# Patient Record
Sex: Female | Born: 1955 | Race: White | Hispanic: No | Marital: Married | Smoking: Never smoker
Health system: Southern US, Community
[De-identification: ages and names within clinical notes are randomized; demographics above are authoritative.]

## PROBLEM LIST (undated history)

## (undated) DIAGNOSIS — K219 Gastro-esophageal reflux disease without esophagitis: Secondary | ICD-10-CM

## (undated) DIAGNOSIS — N951 Menopausal and female climacteric states: Secondary | ICD-10-CM

## (undated) DIAGNOSIS — E785 Hyperlipidemia, unspecified: Secondary | ICD-10-CM

## (undated) DIAGNOSIS — I639 Cerebral infarction, unspecified: Secondary | ICD-10-CM

## (undated) DIAGNOSIS — E119 Type 2 diabetes mellitus without complications: Secondary | ICD-10-CM

## (undated) DIAGNOSIS — R197 Diarrhea, unspecified: Secondary | ICD-10-CM

## (undated) DIAGNOSIS — M47812 Spondylosis without myelopathy or radiculopathy, cervical region: Secondary | ICD-10-CM

## (undated) DIAGNOSIS — R2681 Unsteadiness on feet: Secondary | ICD-10-CM

## (undated) DIAGNOSIS — H269 Unspecified cataract: Secondary | ICD-10-CM

## (undated) DIAGNOSIS — A048 Other specified bacterial intestinal infections: Secondary | ICD-10-CM

## (undated) DIAGNOSIS — T7840XA Allergy, unspecified, initial encounter: Secondary | ICD-10-CM

## (undated) DIAGNOSIS — R002 Palpitations: Secondary | ICD-10-CM

## (undated) DIAGNOSIS — M542 Cervicalgia: Secondary | ICD-10-CM

## (undated) DIAGNOSIS — M47817 Spondylosis without myelopathy or radiculopathy, lumbosacral region: Secondary | ICD-10-CM

## (undated) DIAGNOSIS — R079 Chest pain, unspecified: Secondary | ICD-10-CM

## (undated) DIAGNOSIS — Z01810 Encounter for preprocedural cardiovascular examination: Secondary | ICD-10-CM

## (undated) DIAGNOSIS — I10 Essential (primary) hypertension: Secondary | ICD-10-CM

## (undated) DIAGNOSIS — K635 Polyp of colon: Secondary | ICD-10-CM

## (undated) DIAGNOSIS — E209 Hypoparathyroidism, unspecified: Secondary | ICD-10-CM

## (undated) DIAGNOSIS — J309 Allergic rhinitis, unspecified: Secondary | ICD-10-CM

## (undated) DIAGNOSIS — M810 Age-related osteoporosis without current pathological fracture: Secondary | ICD-10-CM

## (undated) DIAGNOSIS — R42 Dizziness and giddiness: Secondary | ICD-10-CM

## (undated) DIAGNOSIS — H905 Unspecified sensorineural hearing loss: Secondary | ICD-10-CM

## (undated) DIAGNOSIS — F419 Anxiety disorder, unspecified: Secondary | ICD-10-CM

## (undated) DIAGNOSIS — J45909 Unspecified asthma, uncomplicated: Secondary | ICD-10-CM

## (undated) DIAGNOSIS — E039 Hypothyroidism, unspecified: Secondary | ICD-10-CM

## (undated) HISTORY — PX: CHOLECYSTECTOMY: SHX55

## (undated) HISTORY — DX: Unspecified asthma, uncomplicated: J45.909

## (undated) HISTORY — PX: COLONOSCOPY: SHX174

## (undated) HISTORY — DX: Hyperlipidemia, unspecified: E78.5

## (undated) HISTORY — PX: ALLOGRAFT SPINE MORSELIZED: SUR18

## (undated) HISTORY — DX: Cervicalgia: M54.2

## (undated) HISTORY — PX: APPENDECTOMY: SHX54

## (undated) HISTORY — DX: Cerebral infarction, unspecified: I63.9

## (undated) HISTORY — DX: Essential (primary) hypertension: I10

## (undated) HISTORY — DX: Polyp of colon: K63.5

## (undated) HISTORY — DX: Hypothyroidism, unspecified: E03.9

## (undated) HISTORY — DX: Menopausal and female climacteric states: N95.1

## (undated) HISTORY — DX: Anxiety disorder, unspecified: F41.9

## (undated) HISTORY — DX: Diarrhea, unspecified: R19.7

## (undated) HISTORY — DX: Unsteadiness on feet: R26.81

## (undated) HISTORY — PX: UPPER GASTROINTESTINAL ENDOSCOPY: SHX188

## (undated) HISTORY — DX: Gastro-esophageal reflux disease without esophagitis: K21.9

## (undated) HISTORY — DX: Type 2 diabetes mellitus without complications: E11.9

## (undated) HISTORY — DX: Age-related osteoporosis without current pathological fracture: M81.0

## (undated) HISTORY — DX: Palpitations: R00.2

## (undated) HISTORY — DX: Encounter for preprocedural cardiovascular examination: Z01.810

## (undated) HISTORY — PX: LAPAROTOMY: SHX154

## (undated) HISTORY — PX: HYSTEROSCOPY WITH SALPINGOGRAM: SHX6653

## (undated) HISTORY — DX: Dizziness and giddiness: R42

## (undated) HISTORY — DX: Hypoparathyroidism, unspecified: E20.9

## (undated) HISTORY — PX: ABDOMINAL HYSTERECTOMY: SHX81

## (undated) HISTORY — PX: CATARACT EXTRACTION: SUR2

## (undated) HISTORY — DX: Unspecified cataract: H26.9

## (undated) HISTORY — DX: Other specified bacterial intestinal infections: A04.8

## (undated) HISTORY — DX: Allergy, unspecified, initial encounter: T78.40XA

## (undated) HISTORY — DX: Chest pain, unspecified: R07.9

## (undated) HISTORY — PX: TONSILLECTOMY: SUR1361

## (undated) HISTORY — DX: Unspecified sensorineural hearing loss: H90.5

## (undated) HISTORY — DX: Allergic rhinitis, unspecified: J30.9

## (undated) HISTORY — DX: Spondylosis without myelopathy or radiculopathy, lumbosacral region: M47.817

## (undated) HISTORY — DX: Spondylosis without myelopathy or radiculopathy, cervical region: M47.812

---

## 1999-11-25 DIAGNOSIS — K529 Noninfective gastroenteritis and colitis, unspecified: Secondary | ICD-10-CM

## 1999-11-25 HISTORY — DX: Noninfective gastroenteritis and colitis, unspecified: K52.9

## 2003-07-14 DIAGNOSIS — A0472 Enterocolitis due to Clostridium difficile, not specified as recurrent: Secondary | ICD-10-CM

## 2003-07-14 HISTORY — DX: Enterocolitis due to Clostridium difficile, not specified as recurrent: A04.72

## 2003-08-04 DIAGNOSIS — E876 Hypokalemia: Secondary | ICD-10-CM

## 2003-08-04 HISTORY — DX: Hypokalemia: E87.6

## 2006-08-14 DIAGNOSIS — Z8669 Personal history of other diseases of the nervous system and sense organs: Secondary | ICD-10-CM

## 2006-08-14 HISTORY — DX: Personal history of other diseases of the nervous system and sense organs: Z86.69

## 2009-02-10 DEATH — deceased

## 2018-05-28 ENCOUNTER — Ambulatory Visit (INDEPENDENT_AMBULATORY_CARE_PROVIDER_SITE_OTHER): Payer: 59 | Admitting: Internal Medicine

## 2018-06-04 ENCOUNTER — Ambulatory Visit (INDEPENDENT_AMBULATORY_CARE_PROVIDER_SITE_OTHER): Payer: 59 | Admitting: Internal Medicine

## 2019-01-01 NOTE — Progress Notes (Addendum)
Subjective:    Patient ID: Laurie Finley, female    DOB: February 05, 1956, 63 y.o.   MRN: [id number]  HPI Laurie Finley is a 63 year old female with a past medical history of asthma. CVA 05/23/2018 [facility] [place]. DM II diagnosed in 05/2017. HTN. Rapid heart beat. IBS. GERD. H. Pylori in 2005. C. Diff after she took antibiotics for H.pylori treatment. Colon polyps. Anemia. Vitamin B12 deficiency.  High cholesterol. Colitis 2001 " I took pills for 4 years". Hypothyroidism. Vertigo. Osteoporosis. PSH: total hysterectomy, appendectomy, cholecystectomy, tonsillectomy, cervical stenosis surgery.  She presents today with complaints of worsening acid reflux symptoms.  No dysphagia.  She has intermittent upper and lower abdominal pain.  She describes having  episodes of having lower abdominal pain then passes a solid stool then 2-3 subsequent stools are loose.  Has increased nausea during this time and she has vomited a few times during these episodes.  She reports her stool is sometimes very dark brown, she is not sure if she is passing black stool.  She infrequently sees red blood on the toilet tissue and on the stool.  Reports her last colonoscopy was March 2017 performed by her surgeon Dr. Davina [place]oke in [place].  She is unsure of the results. I will request a copy of colonoscopy results for further review.  Brother with history of colon cancer.  Sister with history of hepatitis C and  liver cancer.  Mother with history of pancreatic cancer.  She is taking Prilosec 20 mg 2 to 3 days weekly.  She is taking Rolaids 3-4 tabs a few days weekly as well.  In the past, if she took Prilosec daily she noticed hair loss.  Nexium resulted in headaches.  Pepcid was ineffective.  No fever, sweats or chills.  No weight loss.  Her husband is present.  ADDENDUM: Colonoscopy by Dr. Theodosia Blender 05/24/2015 received.  The colonoscopy was normal, no polyps, no evidence of inflammatory process, small liquid stool and scant amount of  diverticulosis.  A repeat colonoscopy in 5 years was recommended.  Past Medical History:  Diagnosis Date  . Arthritis of lumbosacral spine   . Asthma   . Cervical arthritis   . Chest pain   . Diabetes ([place])   . Diarrhea   . Dizzinesses   . Essential hypertension   . GERD (gastroesophageal reflux disease)   . H. pylori infection   . Hyperlipemia   . Hypothyroid   . Palpitations   . Preoperative cardiovascular examination   . Stroke Laurie Finley)     See HPI for past surgical history  Current Outpatient Medications on File Prior to Visit  Medication Sig Dispense Refill  . acetaminophen (TYLENOL) 500 MG tablet Take 500 mg by mouth every 6 (six) hours as needed for mild pain or moderate pain.    . Albuterol Sulfate 108 (90 Base) MCG/ACT AEPB Inhale into the lungs. 2 puffs every 6 hours.    Marland Kitchen amitriptyline (ELAVIL) 10 MG tablet Take 10 mg by mouth at bedtime.    Marland Kitchen amLODipine (NORVASC) 5 MG tablet Take 5 mg by mouth daily. Patient takes 1/2 in the morning and 1/2 at supper.    . Butalbital-APAP-Caff-Cod 50-300-40-30 MG CAPS Take by mouth as needed.    . Ca Carbonate-Mag Hydroxide (ROLAIDS PO) Take by mouth. Patient states that she uses 2-3 times weekly.    . Cyanocobalamin (VITAMIN B-12 IJ) Inject as directed every 30 (thirty) days.    Marland Kitchen denosumab (PROLIA) 60 MG/ML SOSY injection  Inject 60 mg into the skin every 6 (six) months.    . diazepam (VALIUM) 2 MG tablet Take 2 mg by mouth. Patient takes as needed for dizziness.    . diphenhydrAMINE (BENADRYL) 25 MG tablet Take 25 mg by mouth at bedtime.    . ergocalciferol (VITAMIN D2) 1.25 MG (50000 UT) capsule Take 50,000 Units by mouth once a week. Patient states that she takes 1 by mouth twice a week.    . fluticasone furoate-vilanterol (BREO ELLIPTA) 200-25 MCG/INH AEPB Inhale 1 puff into the lungs daily. Patient uses the 100/25 - 1 puff every morning.    Marland Kitchen. LEVOTHYROXINE SODIUM PO Take 25 mcg by mouth.    . meclizine (ANTIVERT) 12.5 MG tablet  Take 12.5 mg by mouth 3 (three) times daily as needed for dizziness.    Marland Kitchen. METOPROLOL TARTRATE PO Take by mouth. 12.5 mg in the morning and 12.5 mg in at night.    Marland Kitchen. omeprazole (PRILOSEC) 20 MG capsule Take 20 mg by mouth as needed.    . pravastatin (PRAVACHOL) 10 MG tablet Take 10 mg by mouth daily.     No current facility-administered medications on file prior to visit.    Allergies  Allergen Reactions  . Adhesive [Tape]   . Asa [Aspirin]   . Asmanex (120 Metered Doses) [Mometasone Furoate]   . Biaxin [Clarithromycin]   . Bonine [Meclizine Hcl]   . Erythromycin   . Evista [Raloxifene Hcl]   . Influenza Vaccines   . Iodinated Diagnostic Agents   . Nexium [Esomeprazole Magnesium]   . Norco [Hydrocodone-Acetaminophen]   . Oxycodone   . Prilosec [Omeprazole]   . Qvar [Beclomethasone]   . Tessalon [Benzonatate]    Family History  Problem Relation Age of Onset  . Osteoporosis Mother   . Stroke Mother   . Pancreatic cancer Mother   . Hypertension Father   . Epilepsy Sister   . Hypertension Sister   . Epilepsy Brother   . Colon cancer Brother   . Hypertension Brother   . Liver cancer Sister   . Diabetes Sister   . Hypertension Sister   . Healthy Sister   . Hypertension Sister   . Hypertension Sister   . Hypertension Sister   . Hypertension Sister   . Cirrhosis Sister   . Hepatitis C Sister   . Leukemia Brother    Social History   Socioeconomic History  . Marital status: Married    Spouse name: Not on file  . Number of children: Not on file  . Years of education: Not on file  . Highest education level: Not on file  Occupational History  . Not on file  Social Needs  . Financial resource strain: Not on file  . Food insecurity    Worry: Not on file    Inability: Not on file  . Transportation needs    Medical: Not on file    Non-medical: Not on file  Tobacco Use  . Smoking status: Never Smoker  . Smokeless tobacco: Never Used  Substance and Sexual Activity  .  Alcohol use: Never    Frequency: Never  . Drug use: Never  . Sexual activity: Not on file  Lifestyle  . Physical activity    Days per week: Not on file    Minutes per session: Not on file  . Stress: Not on file  Relationships  . Social Musicianconnections    Talks on phone: Not on file    Gets together: Not  on file    Attends religious service: Not on file    Active member of club or organization: Not on file    Attends meetings of clubs or organizations: Not on file    Relationship status: Not on file  . Intimate partner violence    Fear of current or ex partner: Not on file    Emotionally abused: Not on file    Physically abused: Not on file    Forced sexual activity: Not on file  Other Topics Concern  . Not on file  Social History Narrative  . Not on file   Review of Systems see HPI, all other systems reviewed and are negative     Objective:   Physical Exam  BP 111/74   Pulse 75   Temp 97.9 F (36.6 C) (Oral)   Ht 5\' 2"  (1.575 m)   Wt 222 lb 1.6 oz (100.7 kg)   BMI 40.62 kg/m  General: 63 year old obese female in no acute distress, she is ambulating with assistance of a walker Eyes: Sclera nonicteric, conjunctiva pink Mouth: Dentition intact, no ulcers or lesions Neck: Supple, no lymphadenopathy Heart: Regular rate and rhythm, no murmurs Lungs: Breath sounds clear throughout Abdomen: Soft, nondistended, nontender, positive bowel sounds to all 4 quadrants, no HSM Extremities: No edema Neuro: Alert and oriented x4, answers questions appropriately    Assessment & Plan:   64.  63 year old female with GERD.  Remote history of H. Pylori. -She is willing to take omeprazole 20 mg once daily as this is the only PPI she is used in the past that has relieved her symptoms, however, she will discontinue if she develops alopecia -Gaviscon 1 tablespoon 1-2 3 times daily as needed -CBC, CMP, CRP and anemia panel  2.  Upper and lower abdominal pain, no current pain -I discussed  scheduling an abdominal/pelvic CT if her episodic abdominal pain and vomiting recurs.  Her abdominal exam today is negative.  3.  History of anemia and B12 deficiency  4.  Status post CVA March 2020  Follow-up in the office in 6 weeks

## 2019-01-02 ENCOUNTER — Ambulatory Visit (INDEPENDENT_AMBULATORY_CARE_PROVIDER_SITE_OTHER): Payer: 59 | Admitting: Nurse Practitioner

## 2019-01-02 ENCOUNTER — Encounter (INDEPENDENT_AMBULATORY_CARE_PROVIDER_SITE_OTHER): Payer: Self-pay | Admitting: Nurse Practitioner

## 2019-01-02 ENCOUNTER — Other Ambulatory Visit: Payer: Self-pay

## 2019-01-02 DIAGNOSIS — R1030 Lower abdominal pain, unspecified: Secondary | ICD-10-CM

## 2019-01-02 DIAGNOSIS — D649 Anemia, unspecified: Secondary | ICD-10-CM

## 2019-01-02 DIAGNOSIS — Z86010 Personal history of colonic polyps: Secondary | ICD-10-CM | POA: Diagnosis not present

## 2019-01-02 DIAGNOSIS — K219 Gastro-esophageal reflux disease without esophagitis: Secondary | ICD-10-CM | POA: Insufficient documentation

## 2019-01-02 NOTE — Patient Instructions (Signed)
1. Take Omeprazole 20mg  one capsule by mouth once daily if tolerated. Gaviscon 1 tablespoon  1 to 3 times daily as needed for acid reflux.  2. Complete the provided lab order.  3. Florastor probiotic 1 capsule by mouth twice daily.  4. Benefiber 1 tablespoon once daily.  5. I will request a copy of your 2017 colonoscopy for further review   6. Follow up in the office in 6 weeks  7. Call our office if your symptoms worsen

## 2019-01-03 LAB — CBC WITH DIFFERENTIAL/PLATELET
Absolute Monocytes: 646 cells/uL (ref 200–950)
Basophils Absolute: 57 cells/uL (ref 0–200)
Basophils Relative: 0.8 %
Eosinophils Absolute: 114 cells/uL (ref 15–500)
Eosinophils Relative: 1.6 %
HCT: 46 % — ABNORMAL HIGH (ref 35.0–45.0)
Hemoglobin: 15.6 g/dL — ABNORMAL HIGH (ref 11.7–15.5)
Lymphs Abs: 2102 cells/uL (ref 850–3900)
MCH: 30.5 pg (ref 27.0–33.0)
MCHC: 33.9 g/dL (ref 32.0–36.0)
MCV: 90 fL (ref 80.0–100.0)
MPV: 11.6 fL (ref 7.5–12.5)
Monocytes Relative: 9.1 %
Neutro Abs: 4182 cells/uL (ref 1500–7800)
Neutrophils Relative %: 58.9 %
Platelets: 230 10*3/uL (ref 140–400)
RBC: 5.11 10*6/uL — ABNORMAL HIGH (ref 3.80–5.10)
RDW: 12.5 % (ref 11.0–15.0)
Total Lymphocyte: 29.6 %
WBC: 7.1 10*3/uL (ref 3.8–10.8)

## 2019-01-03 LAB — COMPLETE METABOLIC PANEL WITH GFR
AG Ratio: 1.6 (calc) (ref 1.0–2.5)
ALT: 19 U/L (ref 6–29)
AST: 21 U/L (ref 10–35)
Albumin: 4.4 g/dL (ref 3.6–5.1)
Alkaline phosphatase (APISO): 57 U/L (ref 37–153)
BUN: 13 mg/dL (ref 7–25)
CO2: 27 mmol/L (ref 20–32)
Calcium: 10.6 mg/dL — ABNORMAL HIGH (ref 8.6–10.4)
Chloride: 101 mmol/L (ref 98–110)
Creat: 0.8 mg/dL (ref 0.50–0.99)
GFR, Est African American: 90 mL/min/{1.73_m2} (ref >=60)
GFR, Est Non African American: 78 mL/min/{1.73_m2} (ref >=60)
Globulin: 2.7 g/dL (calc) (ref 1.9–3.7)
Glucose, Bld: 128 mg/dL (ref 65–139)
Potassium: 4.2 mmol/L (ref 3.5–5.3)
Sodium: 139 mmol/L (ref 135–146)
Total Bilirubin: 0.3 mg/dL (ref 0.2–1.2)
Total Protein: 7.1 g/dL (ref 6.1–8.1)

## 2019-01-03 LAB — FERRITIN: Ferritin: 530 ng/mL — ABNORMAL HIGH (ref 16–288)

## 2019-01-03 LAB — IRON, TOTAL/TOTAL IRON BINDING CAP
%SAT: 33 % (calc) (ref 16–45)
Iron: 107 ug/dL (ref 45–160)
TIBC: 322 mcg/dL (calc) (ref 250–450)

## 2019-01-03 LAB — C-REACTIVE PROTEIN: CRP: 2.2 mg/L (ref <8.0)

## 2019-01-03 LAB — FOLATE: Folate: 12.2 ng/mL

## 2019-07-24 ENCOUNTER — Encounter: Payer: Self-pay | Admitting: Nurse Practitioner

## 2019-08-07 ENCOUNTER — Encounter: Payer: Self-pay | Admitting: Nurse Practitioner

## 2019-08-07 ENCOUNTER — Ambulatory Visit (INDEPENDENT_AMBULATORY_CARE_PROVIDER_SITE_OTHER): Payer: Medicare Other | Admitting: Nurse Practitioner

## 2019-08-07 ENCOUNTER — Other Ambulatory Visit (INDEPENDENT_AMBULATORY_CARE_PROVIDER_SITE_OTHER): Payer: Medicare Other

## 2019-08-07 VITALS — BP 128/76 | HR 63 | Ht 62.0 in | Wt 229.1 lb

## 2019-08-07 DIAGNOSIS — R1030 Lower abdominal pain, unspecified: Secondary | ICD-10-CM

## 2019-08-07 DIAGNOSIS — R1010 Upper abdominal pain, unspecified: Secondary | ICD-10-CM

## 2019-08-07 DIAGNOSIS — R197 Diarrhea, unspecified: Secondary | ICD-10-CM

## 2019-08-07 DIAGNOSIS — K921 Melena: Secondary | ICD-10-CM

## 2019-08-07 DIAGNOSIS — R112 Nausea with vomiting, unspecified: Secondary | ICD-10-CM

## 2019-08-07 LAB — CBC WITH DIFFERENTIAL/PLATELET
Basophils Absolute: 0.1 10*3/uL (ref 0.0–0.1)
Basophils Relative: 0.8 % (ref 0.0–3.0)
Eosinophils Absolute: 0.2 10*3/uL (ref 0.0–0.7)
Eosinophils Relative: 2 % (ref 0.0–5.0)
HCT: 46.4 % — ABNORMAL HIGH (ref 36.0–46.0)
Hemoglobin: 15.8 g/dL — ABNORMAL HIGH (ref 12.0–15.0)
Lymphocytes Relative: 28.5 % (ref 12.0–46.0)
Lymphs Abs: 2.3 10*3/uL (ref 0.7–4.0)
MCHC: 34.2 g/dL (ref 30.0–36.0)
MCV: 89.6 fl (ref 78.0–100.0)
Monocytes Absolute: 0.8 10*3/uL (ref 0.1–1.0)
Monocytes Relative: 9.5 % (ref 3.0–12.0)
Neutro Abs: 4.8 10*3/uL (ref 1.4–7.7)
Neutrophils Relative %: 59.2 % (ref 43.0–77.0)
Platelets: 226 10*3/uL (ref 150.0–400.0)
RBC: 5.17 Mil/uL — ABNORMAL HIGH (ref 3.87–5.11)
RDW: 13.1 % (ref 11.5–15.5)
WBC: 8.1 10*3/uL (ref 4.0–10.5)

## 2019-08-07 LAB — COMPREHENSIVE METABOLIC PANEL
ALT: 17 U/L (ref 0–35)
AST: 18 U/L (ref 0–37)
Albumin: 4.6 g/dL (ref 3.5–5.2)
Alkaline Phosphatase: 64 U/L (ref 39–117)
BUN: 14 mg/dL (ref 6–23)
CO2: 29 mEq/L (ref 19–32)
Calcium: 10.5 mg/dL (ref 8.4–10.5)
Chloride: 100 mEq/L (ref 96–112)
Creatinine, Ser: 0.87 mg/dL (ref 0.40–1.20)
GFR: 65.3 mL/min (ref >60.00)
Glucose, Bld: 153 mg/dL — ABNORMAL HIGH (ref 70–99)
Potassium: 4.1 mEq/L (ref 3.5–5.1)
Sodium: 135 mEq/L (ref 135–145)
Total Bilirubin: 0.5 mg/dL (ref 0.2–1.2)
Total Protein: 7.8 g/dL (ref 6.0–8.3)

## 2019-08-07 LAB — IGA: IgA: 197 mg/dL (ref 68–378)

## 2019-08-07 LAB — C-REACTIVE PROTEIN: CRP: <1 mg/dL (ref 0.5–20.0)

## 2019-08-07 NOTE — Progress Notes (Signed)
08/07/2019 Laurie Finley [id number] 1955-06-17   CHIEF COMPLAINT: Abdominal pain and diarrhea   HISTORY OF PRESENT ILLNESS:  Laurie Finley is a 64 year old female with a past medical history of Asthma. CVA 05/23/2018. DM II diagnosed in 05/2017. HTN. IBS. GERD. H. Pylori x 2 last occurred in 2005. C. Diff after she took antibiotics for H.pylori treatment. Colon polyps. Anemia. Vitamin B12 deficiency. High cholesterol. Colitis 2001 " I took pills for 4 years". Hypothyroidism. Vertigo.  Past surgical history includes a total hysterectomy, appendectomy, cholecystectomy, tonsillectomy, cervical stenosis surgery.   I initially I saw the patient at Dr. Gentry Fitz [facility] office in [facility] 01/02/2019 further evaluation regarding reflux symptoms with intermittent upper and lower abdominal pain.  At that time, her abdominal pain abated and her exam was negative.  She was prescribed Omeprazole 20 mg once daily and Gaviscon as needed.  She was advised to follow-up in the office in 6 weeks which was not done.  She presents to our [place] office today as referred by her PCP Dr. [name] for further evaluation for upper and lower abdominal pain, nausea and vomiting and worsening diarrhea since 02/2019. She stated her symptoms are interfering with her quality of living.  Her sister recently died from liver cancer and she asked her reviewing prior to the funeral ceremony as she could not get out of the bathroom in time.  She reports passing 3-4 loose mud to watery bloody diarrhea stools at least 3 to 4 days weekly.  She describes seeing bright red blood mixed in the stool and on the toilet tissue.  No melena.  She can go 1 or 2 days without having any bowel movement.  She has a solid stool approximately once weekly.  No associated anal or rectal pain.  She denies having any history of obvious hemorrhoids.  She underwent a colonoscopy by Dr. Theodosia Blender 05/24/2015 which was normal, no polyps, no evidence of inflammatory  process, small liquid stool and scant amount of diverticulosis.  A repeat colonoscopy in 5 years was recommended. Her brother died from colon cancer at the age of 71. She reports having occasional heartburn which typically occurs if she skips Prilosec.  She underwent an EGD 20 years ago which she reported showed reflux.  She reported testing positive for H. pylori in 2011 which was treated with antibiotics which resulted in a C. difficile infection which required hospitalization.  She last took an antibiotic for an ear infection 02/2019.  She is taking Florastor 1 p.o. twice daily.  She is taking Meclizine 12.5 mg p.o. as needed which settles her nausea. She has infrequent vomiting. No specific food triggers.  She reported having dyspnea/shortness of breath for which she completed a stress test and echo approximately 4 weeks ago which she reported was normal.  She is followed by cardiologist Dr. Hazle Nordmann in [place], [place].  No further cardiac evaluation was recommended.  No chest pain or palpitations.  No current shortness of breath.  No hemoptysis.    Laboratory studies 04/21/2019: WBC 6.7.  Hemoglobin 15.9.  Hematocrit 47.2.  MCV 89.  Platelet 211.  Sodium 138.  Potassium 3.9.  BUN 13.  Creatinine 0.77.  Glucose 147.  Total bili 0.4.  Alk phos 68.  AST 23.  ALT 19.  Past Medical History:  Diagnosis Date  . Allergic rhinitis from [place] clinic encounter 06/30/2019  . Anxiety disorder    from [place] [facility] encounter 06/30/2019  . Arthritis of lumbosacral spine   .  Asthma   . Asthma    from [place] clinic encounter 06/30/2019  . Cervical arthritis   . Chest pain   . Colitis 11/25/1999   from [place] clinic encounter 06/30/2019  . Colitis due to Clostridium difficile 07/14/2003   from [place] clinic encounter 06/30/2019  . Colon polyp    from [place] clinic encounter 06/30/2019  . Diabetes ([place])   . Diarrhea   . Dizzinesses   . Essential hypertension   . GERD (gastroesophageal reflux disease)     . H. pylori infection   . Hx of labyrinthitis 08/14/2006   from [place] clinic encounter 06/30/2019  . Hyperlipemia   . Hypokalemia 08/04/2003   from [place] clinic encounter 06/30/2019  . Hypokalemia 08/04/2003  . Hypothyroid   . Menopausal syndrome    from [place] clinic encounter 06/30/2019  . Neck pain    from [place] clinic encounter 06/30/2019  . Palpitations   . Preoperative cardiovascular examination   . SNHL (sensorineural hearing loss)    from [place] clinic encounter 06/30/2019  . Stroke ([place])   . Unsteady gait    from [place] clinic encounter 06/30/2019   Past Surgical History:  Procedure Laterality Date  . ALLOGRAFT SPINE MORSELIZED Bilateral 1226/2017   from [place] clinic encounter 06/30/2019, DrDavid Prior  . APPENDECTOMY    . CATARACT EXTRACTION    . CHOLECYSTECTOMY    . COLONOSCOPY    . HYSTEROSCOPY WITH SALPINGOGRAM    . TONSILLECTOMY    . UPPER GASTROINTESTINAL ENDOSCOPY      reports that she has never smoked. She has never used smokeless tobacco. She reports that she does not drink alcohol or use drugs. family history includes Cirrhosis in her sister; Colon cancer in her brother; Diabetes in her sister; Epilepsy in her brother and sister; Healthy in her sister; Hepatitis C in her sister; Hypertension in her brother, father, sister, sister, sister, sister, sister, and sister; Leukemia in her brother; Liver cancer in her sister; Osteoporosis in her mother; Pancreatic cancer in her mother; Stroke in her mother. Allergies  Allergen Reactions  . Eggs Or Egg-Derived Products Anaphylaxis and Other (See Comments)  . Adhesive [Tape]   . Asa [Aspirin]   . Asmanex (120 Metered Doses) [Mometasone Furoate]   . Biaxin [Clarithromycin]   . Bonine [Meclizine Hcl]   . Erythromycin   . Evista [Raloxifene Hcl]   . Influenza Vaccines   . Iodinated Diagnostic Agents   . Nexium [Esomeprazole Magnesium]   . Norco [Hydrocodone-Acetaminophen]   . Oxycodone   . Prilosec  [Omeprazole]   . Qvar [Beclomethasone]   . Tessalon [Benzonatate]   IV contrast resulted in anaphylaxis    Outpatient Encounter Medications as of 08/07/2019  Medication Sig  . acetaminophen (TYLENOL) 500 MG tablet Take 500 mg by mouth every 6 (six) hours as needed for mild pain or moderate pain.  . Albuterol Sulfate 108 (90 Base) MCG/ACT AEPB Inhale into the lungs. 2 puffs every 6 hours.  Marland Kitchen amitriptyline (ELAVIL) 10 MG tablet Take 10 mg by mouth at bedtime.  Marland Kitchen amLODipine (NORVASC) 5 MG tablet Take 5 mg by mouth daily. Patient takes 1/2 in the morning and 1/2 at supper.  . Butalbital-APAP-Caff-Cod 50-300-40-30 MG CAPS Take by mouth as needed.  . Ca Carbonate-Mag Hydroxide (ROLAIDS PO) Take by mouth. Patient states that she uses 2-3 times weekly.  . Cyanocobalamin (VITAMIN B-12 IJ) Inject as directed every 30 (thirty) days.  Marland Kitchen denosumab (PROLIA) 60 MG/ML SOSY injection Inject 60 mg into the  skin every 6 (six) months.  . diazepam (VALIUM) 2 MG tablet Take 2 mg by mouth. Patient takes as needed for dizziness.  . diphenhydrAMINE (BENADRYL) 25 MG tablet Take 25 mg by mouth at bedtime.  . ergocalciferol (VITAMIN D2) 1.25 MG (50000 UT) capsule Take 50,000 Units by mouth once a week. Patient states that she takes 1 by mouth twice a week.  . fluticasone furoate-vilanterol (BREO ELLIPTA) 200-25 MCG/INH AEPB Inhale 1 puff into the lungs daily. Patient uses the 100/25 - 1 puff every morning.  Marland Kitchen LEVOTHYROXINE SODIUM PO Take 25 mcg by mouth.  . meclizine (ANTIVERT) 12.5 MG tablet Take 12.5 mg by mouth 3 (three) times daily as needed for dizziness.  Marland Kitchen METOPROLOL TARTRATE PO Take by mouth. 12.5 mg in the morning and 12.5 mg in at night.  Marland Kitchen omeprazole (PRILOSEC) 20 MG capsule Take 20 mg by mouth as needed.  . pravastatin (PRAVACHOL) 10 MG tablet Take 10 mg by mouth daily.  Marland Kitchen saccharomyces boulardii (FLORASTOR) 250 MG capsule Take 250 mg by mouth 2 (two) times daily.  . Wheat Dextrin (BENEFIBER ON THE GO PO)  Take 1 packet by mouth. 3/3.5 gram packet daily from [place] clinic encounter 06/30/2019   No facility-administered encounter medications on file as of 08/07/2019.     REVIEW OF SYSTEMS: All other systems reviewed and negative except where noted in the History of Present Illness.   PHYSICAL EXAM: BP 128/76   Pulse 63   Ht 5' 2"  (1.575 m)   Wt 229 lb 2 oz (103.9 kg)   BMI 41.91 kg/m  General: Well developed 64 year old female in no acute distress. Head: Normocephalic and atraumatic. Eyes:  Sclerae non-icteric, conjunctive pink. Ears: Normal auditory acuity. Mouth: Dentition intact. No ulcers or lesions.  Neck: Supple, no lymphadenopathy or thyromegaly.  Lungs: Clear bilaterally to auscultation without wheezes, crackles or rhonchi. Heart: Regular rate and rhythm. No murmur, rub or gallop appreciated.  Abdomen: Soft, non distended.  Epigastric and lower abdominal tenderness throughout without rebound or guarding.  No masses. No hepatosplenomegaly. Normoactive bowel sounds x 4 quadrants.  Rectal: Deferred.  Musculoskeletal: Symmetrical with no gross deformities. Skin: Warm and dry. No rash or lesions on visible extremities. Extremities: No edema. Neurological: Alert oriented x 4, no focal deficits.  Psychological:  Alert and cooperative. Normal mood and affect.  ASSESSMENT AND PLAN:  40.  64 year old female with bloody diarrhea.  Remote history of "colitis" and c. Diff.  -CBC, CMP and CRP.  GI pathogen panel. -Colonoscopy benefits and risks discussed including risk with sedation, risk of bleeding, perforation and infection   2.  Upper and lower abdominal pain -Abdominal/pelvic CAT scan with oral contrast only -Be guarded 1 p.o. twice daily as needed  3.  Nausea/vomiting -PT prefers to continue using Meclizine as needed  4. GERD. History of H. Pylori -Continue Omeprazole 70m daily for now -EGD to be done at the time of her colonoscopy, benefits and risks discussed including  risk with sedation, risk of bleeding, perforation and infection   Follow-up to be determined after the above evaluation completed        CC:  EEber Hong MD

## 2019-08-07 NOTE — Patient Instructions (Signed)
If you are age 65 or older, your body mass index should be between 23-30. Your Body mass index is 41.91 kg/m. If this is out of the aforementioned range listed, please consider follow up with your Primary Care Provider.  If you are age 65 or younger, your body mass index should be between 19-25. Your Body mass index is 41.91 kg/m. If this is out of the aformentioned range listed, please consider follow up with your Primary Care Provider.    Your provider has requested that you go to the basement level for lab work before leaving today. Press "B" on the elevator. The lab is located at the first door on the left as you exit the elevator.  We have given you a sample of [place] which is what you will use for your prep for the colonoscopy/endoscopy.  Please use IB guard 1 capsule by mouth twice daily as needed for abdominal pain (samples enclosed)   You are scheduled on 08/15/2019 at 12:30am at [facility]. You should arrive 15 minutes prior to your appointment time for registration. Please follow the written instructions below on the day of your exam:  WARNING: IF YOU ARE ALLERGIC TO IODINE/X-RAY DYE, PLEASE NOTIFY RADIOLOGY IMMEDIATELY AT [phone]! YOU WILL BE GIVEN A 13 HOUR PREMEDICATION PREP.  1) Do not eat or drink anything after 8:30am (4 hours prior to your test) 2) You have been given 2 bottles of oral contrast to drink. The solution may taste better if refrigerated, but do NOT add ice or any other liquid to this solution. Shake well before drinking.    Drink 1 bottle of contrast @ 10:30am, (2 hours prior to your exam)  Drink 1 bottle of contrast @ 11:30am (1 hour prior to your exam)  You may take any medications as prescribed with a small amount of water, if necessary. If you take any of the following medications: METFORMIN, GLUCOPHAGE, GLUCOVANCE, AVANDAMET, RIOMET, FORTAMET, [place] MET, JANUMET, GLUMETZA or METAGLIP, you MAY be asked to HOLD this medication 48 hours AFTER the  exam.  The purpose of you drinking the oral contrast is to aid in the visualization of your intestinal tract. The contrast solution may cause some diarrhea. Depending on your individual set of symptoms, you may also receive an intravenous injection of x-ray contrast/dye. Plan on being at [facility] for 30 minutes or longer, depending on the type of exam you are having performed.  This test typically takes 30-45 minutes to complete.  If you have any questions regarding your exam or if you need to reschedule, you may call the CT department at [phone] between the hours of 8:00 am and 5:00 pm, Monday-Friday.  ________________________________________________________________________ Due to recent changes in healthcare laws, you may see the results of your imaging and laboratory studies on MyChart before your provider has had a chance to review them.  We understand that in some cases there may be results that are confusing or concerning to you. Not all laboratory results come back in the same time frame and the provider may be waiting for multiple results in order to interpret others.  Please give [place] 48 hours in order for your provider to thoroughly review all the results before contacting the office for clarification of your results.

## 2019-08-08 LAB — TISSUE TRANSGLUTAMINASE, IGA: (tTG) Ab, IgA: <1 U/mL

## 2019-08-12 ENCOUNTER — Telehealth: Payer: Self-pay | Admitting: Nurse Practitioner

## 2019-08-12 ENCOUNTER — Other Ambulatory Visit: Payer: Medicare Other

## 2019-08-12 DIAGNOSIS — R1030 Lower abdominal pain, unspecified: Secondary | ICD-10-CM

## 2019-08-12 DIAGNOSIS — R197 Diarrhea, unspecified: Secondary | ICD-10-CM

## 2019-08-12 DIAGNOSIS — R1010 Upper abdominal pain, unspecified: Secondary | ICD-10-CM

## 2019-08-12 DIAGNOSIS — R112 Nausea with vomiting, unspecified: Secondary | ICD-10-CM

## 2019-08-12 DIAGNOSIS — K921 Melena: Secondary | ICD-10-CM

## 2019-08-12 NOTE — Telephone Encounter (Signed)
Patient is wanting to know if she needs to take anything prior to her CT appt

## 2019-08-13 LAB — GI PROFILE, STOOL, PCR

## 2019-08-14 NOTE — Telephone Encounter (Signed)
Patient has been given all her instructions and has her contrast at home

## 2019-08-15 ENCOUNTER — Other Ambulatory Visit: Payer: Self-pay

## 2019-08-15 ENCOUNTER — Ambulatory Visit (HOSPITAL_COMMUNITY)
Admission: RE | Admit: 2019-08-15 | Discharge: 2019-08-15 | Disposition: A | Discharge status: Home / Self Care | Payer: Medicare Other | Source: Ambulatory Visit | Attending: Nurse Practitioner | Admitting: Nurse Practitioner

## 2019-08-15 DIAGNOSIS — K921 Melena: Secondary | ICD-10-CM | POA: Diagnosis present

## 2019-08-15 DIAGNOSIS — R1030 Lower abdominal pain, unspecified: Secondary | ICD-10-CM | POA: Diagnosis present

## 2019-08-15 DIAGNOSIS — R197 Diarrhea, unspecified: Secondary | ICD-10-CM | POA: Diagnosis present

## 2019-08-15 DIAGNOSIS — R112 Nausea with vomiting, unspecified: Secondary | ICD-10-CM | POA: Insufficient documentation

## 2019-08-15 DIAGNOSIS — R1010 Upper abdominal pain, unspecified: Secondary | ICD-10-CM | POA: Insufficient documentation

## 2019-08-15 NOTE — Progress Notes (Signed)
Reviewed and agree with documentation and assessment and plan. K. Veena Naima Veldhuizen , MD   

## 2019-08-19 ENCOUNTER — Telehealth: Payer: Self-pay | Admitting: Nurse Practitioner

## 2019-08-20 ENCOUNTER — Telehealth: Payer: Self-pay | Admitting: Physician Assistant

## 2019-08-20 ENCOUNTER — Other Ambulatory Visit: Payer: Self-pay

## 2019-08-20 DIAGNOSIS — D508 Other iron deficiency anemias: Secondary | ICD-10-CM

## 2019-08-20 NOTE — Telephone Encounter (Signed)
Received a new hem referral from Dr. Lavon Paganini for IDA. Mrs. Laurie Finley has been cld and scheduled to see Cassie on 6/14 at 1:30pm w/;alabs at 130pm. Pt aware to arrive 15 minutes early.

## 2019-08-20 NOTE — Telephone Encounter (Signed)
Referral placed.

## 2019-08-22 ENCOUNTER — Other Ambulatory Visit: Payer: Self-pay | Admitting: Physician Assistant

## 2019-08-22 DIAGNOSIS — D649 Anemia, unspecified: Secondary | ICD-10-CM

## 2019-08-25 ENCOUNTER — Inpatient Hospital Stay: Payer: Medicare Other

## 2019-08-25 ENCOUNTER — Inpatient Hospital Stay: Payer: Medicare Other | Attending: Physician Assistant | Admitting: Physician Assistant

## 2019-08-25 ENCOUNTER — Other Ambulatory Visit: Payer: Self-pay | Admitting: Physician Assistant

## 2019-08-25 ENCOUNTER — Encounter: Payer: Self-pay | Admitting: Physician Assistant

## 2019-08-25 ENCOUNTER — Other Ambulatory Visit: Payer: Self-pay

## 2019-08-25 VITALS — BP 138/77 | HR 70 | Temp 97.5°F | Resp 18 | Ht 62.0 in | Wt 228.6 lb

## 2019-08-25 DIAGNOSIS — K921 Melena: Secondary | ICD-10-CM | POA: Insufficient documentation

## 2019-08-25 DIAGNOSIS — Z8673 Personal history of transient ischemic attack (TIA), and cerebral infarction without residual deficits: Secondary | ICD-10-CM | POA: Diagnosis not present

## 2019-08-25 DIAGNOSIS — D751 Secondary polycythemia: Secondary | ICD-10-CM | POA: Insufficient documentation

## 2019-08-25 DIAGNOSIS — D649 Anemia, unspecified: Secondary | ICD-10-CM

## 2019-08-25 DIAGNOSIS — Z9049 Acquired absence of other specified parts of digestive tract: Secondary | ICD-10-CM | POA: Diagnosis not present

## 2019-08-25 DIAGNOSIS — Z806 Family history of leukemia: Secondary | ICD-10-CM

## 2019-08-25 DIAGNOSIS — E119 Type 2 diabetes mellitus without complications: Secondary | ICD-10-CM | POA: Diagnosis not present

## 2019-08-25 DIAGNOSIS — J45909 Unspecified asthma, uncomplicated: Secondary | ICD-10-CM

## 2019-08-25 DIAGNOSIS — Z885 Allergy status to narcotic agent status: Secondary | ICD-10-CM | POA: Insufficient documentation

## 2019-08-25 DIAGNOSIS — R531 Weakness: Secondary | ICD-10-CM | POA: Insufficient documentation

## 2019-08-25 DIAGNOSIS — Z800 Family history of malignant neoplasm of digestive organs: Secondary | ICD-10-CM

## 2019-08-25 DIAGNOSIS — Z808 Family history of malignant neoplasm of other organs or systems: Secondary | ICD-10-CM

## 2019-08-25 DIAGNOSIS — Z881 Allergy status to other antibiotic agents status: Secondary | ICD-10-CM | POA: Insufficient documentation

## 2019-08-25 DIAGNOSIS — K760 Fatty (change of) liver, not elsewhere classified: Secondary | ICD-10-CM | POA: Diagnosis not present

## 2019-08-25 DIAGNOSIS — M810 Age-related osteoporosis without current pathological fracture: Secondary | ICD-10-CM

## 2019-08-25 DIAGNOSIS — R109 Unspecified abdominal pain: Secondary | ICD-10-CM | POA: Diagnosis not present

## 2019-08-25 DIAGNOSIS — Z888 Allergy status to other drugs, medicaments and biological substances status: Secondary | ICD-10-CM | POA: Insufficient documentation

## 2019-08-25 DIAGNOSIS — R5383 Other fatigue: Secondary | ICD-10-CM | POA: Insufficient documentation

## 2019-08-25 DIAGNOSIS — Z886 Allergy status to analgesic agent status: Secondary | ICD-10-CM | POA: Insufficient documentation

## 2019-08-25 DIAGNOSIS — Z79899 Other long term (current) drug therapy: Secondary | ICD-10-CM | POA: Diagnosis not present

## 2019-08-25 DIAGNOSIS — R112 Nausea with vomiting, unspecified: Secondary | ICD-10-CM | POA: Diagnosis not present

## 2019-08-25 DIAGNOSIS — Z8719 Personal history of other diseases of the digestive system: Secondary | ICD-10-CM | POA: Insufficient documentation

## 2019-08-25 DIAGNOSIS — I700 Atherosclerosis of aorta: Secondary | ICD-10-CM | POA: Insufficient documentation

## 2019-08-25 DIAGNOSIS — R110 Nausea: Secondary | ICD-10-CM | POA: Diagnosis not present

## 2019-08-25 DIAGNOSIS — E039 Hypothyroidism, unspecified: Secondary | ICD-10-CM

## 2019-08-25 DIAGNOSIS — R630 Anorexia: Secondary | ICD-10-CM | POA: Diagnosis not present

## 2019-08-25 DIAGNOSIS — Z90710 Acquired absence of both cervix and uterus: Secondary | ICD-10-CM | POA: Diagnosis not present

## 2019-08-25 DIAGNOSIS — R0609 Other forms of dyspnea: Secondary | ICD-10-CM | POA: Insufficient documentation

## 2019-08-25 DIAGNOSIS — R197 Diarrhea, unspecified: Secondary | ICD-10-CM

## 2019-08-25 DIAGNOSIS — I10 Essential (primary) hypertension: Secondary | ICD-10-CM

## 2019-08-25 DIAGNOSIS — K219 Gastro-esophageal reflux disease without esophagitis: Secondary | ICD-10-CM

## 2019-08-25 LAB — IRON AND TIBC
Iron: 163 ug/dL — ABNORMAL HIGH (ref 41–142)
Saturation Ratios: 49 % (ref 21–57)
TIBC: 330 ug/dL (ref 236–444)
UIBC: 167 ug/dL (ref 120–384)

## 2019-08-25 LAB — CBC WITH DIFFERENTIAL (CANCER CENTER ONLY)
Abs Immature Granulocytes: 0.02 10*3/uL (ref 0.00–0.07)
Basophils Absolute: 0.1 10*3/uL (ref 0.0–0.1)
Basophils Relative: 1 %
Eosinophils Absolute: 0.1 10*3/uL (ref 0.0–0.5)
Eosinophils Relative: 2 %
HCT: 47.4 % — ABNORMAL HIGH (ref 36.0–46.0)
Hemoglobin: 15.6 g/dL — ABNORMAL HIGH (ref 12.0–15.0)
Immature Granulocytes: 0 %
Lymphocytes Relative: 32 %
Lymphs Abs: 1.9 10*3/uL (ref 0.7–4.0)
MCH: 29.8 pg (ref 26.0–34.0)
MCHC: 32.9 g/dL (ref 30.0–36.0)
MCV: 90.5 fL (ref 80.0–100.0)
Monocytes Absolute: 0.6 10*3/uL (ref 0.1–1.0)
Monocytes Relative: 10 %
Neutro Abs: 3.3 10*3/uL (ref 1.7–7.7)
Neutrophils Relative %: 55 %
Platelet Count: 204 10*3/uL (ref 150–400)
RBC: 5.24 MIL/uL — ABNORMAL HIGH (ref 3.87–5.11)
RDW: 12.8 % (ref 11.5–15.5)
WBC Count: 6 10*3/uL (ref 4.0–10.5)
nRBC: 0 % (ref 0.0–0.2)

## 2019-08-25 LAB — LACTATE DEHYDROGENASE: LDH: 190 U/L (ref 98–192)

## 2019-08-25 LAB — CMP (CANCER CENTER ONLY)
ALT: 18 U/L (ref 0–44)
AST: 20 U/L (ref 15–41)
Albumin: 4.1 g/dL (ref 3.5–5.0)
Alkaline Phosphatase: 64 U/L (ref 38–126)
Anion gap: 11 (ref 5–15)
BUN: 14 mg/dL (ref 8–23)
CO2: 25 mmol/L (ref 22–32)
Calcium: 10.2 mg/dL (ref 8.9–10.3)
Chloride: 102 mmol/L (ref 98–111)
Creatinine: 0.87 mg/dL (ref 0.44–1.00)
GFR, Est AFR Am: >60 mL/min (ref >60)
GFR, Estimated: >60 mL/min (ref >60)
Glucose, Bld: 151 mg/dL — ABNORMAL HIGH (ref 70–99)
Potassium: 4.1 mmol/L (ref 3.5–5.1)
Sodium: 138 mmol/L (ref 135–145)
Total Bilirubin: 0.6 mg/dL (ref 0.3–1.2)
Total Protein: 7.7 g/dL (ref 6.5–8.1)

## 2019-08-25 LAB — FERRITIN: Ferritin: 455 ng/mL — ABNORMAL HIGH (ref 11–307)

## 2019-08-25 NOTE — Progress Notes (Signed)
[place] [phone]   [phone]  CONSULT NOTE  REFERRING PHYSICIAN: Carl Best NP  REASON FOR CONSULTATION:  Polycythemia   HPI Liisa Picone is a 64 y.o. female with a past medical history significant for asthma, arrhythmia, CVA in 2020, cervical stenosis, vertigo, colitis, diabetes mellitus, H. pylori, C. Difficile, hypothyroidism, hypercholesterolemia, osteoporosis, hypertension, and GERD is referred to clinic for evaluation of polycythemia.  Per chart review, it appears that the patient has had mild polycythemia with hemoglobins ranging from 15.6-15.9 over the last 7 months or so.  The patient also had iron studies and ferritin performed in October 2020 which demonstrated an elevated ferritin at 553.  During this time, the patient has been undergoing a work-up for concerns regarding abdominal pain, nausea, vomiting, and diarrhea.  The patient is followed by gastroenterology and she is planning to undergo a colonoscopy next month. She had several lab studies to evaluate her abdominal pain including a CRP, GA, tissue transglutaminase, and GI profile which were all unremarkable.   The patient states that she has nausea and vomiting approximately 3 times a week.  She also has periods of diarrhea.  These episodes occur, she reports 3-4 loose stools with urgency.  Her last episode was on Friday.  She also has associated abdominal pain with having a bowel movement which she describes as "burning".  Denies rectal pain. She recently had a CT of the abdomen and pelvis which did not show a clear etiology of her abdominal pain.  Of note, the patient is status post an appendectomy, hysterectomy, and cholecystectomy.  Besides her GI complaints, which has been worsening over the last year or so, the patient feels fairly well.  For history of cardiopulmonary disease, the patient reports that she is a never smoker.  He has a history of asthma for which she is  prescribed inhalers.  Patient also mentions that her recent CT scan of the abdomen and pelvis noted that the base of her lungs bilaterally demonstrated mild fibrosis.  For her history of heart disease, the patient had an echocardiogram and nuclear stress test performed a few months ago.  The results are not available to me.  The patient notes that there was some abnormality seen with her pulmonary vasculature.  Patient is unsure if this is pulmonary hypertension when asked.   The patient denies any abdominal fullness or early satiety.  No splenomegaly was noted on her recent CT scan of the abdomen and pelvis.  The patient has a history of migraines but no recent headaches.  She experiences episodic vertigo secondary to her cervical stenosis which reportedly caused fluid buildup in her brain.  She denies any visual changes.  She denies any jaundice.  Asked about itching, the patient initially denied any recent episodes of itching but then recalled she has periods where she "scratches herself to death".  During these episodes, she will take benadryl. The patient denies any changes with her appetite or unexplained weight loss.  Interestingly, the patient has a significant family history.  Patient's sister recently has been seen by an oncologist for polycythemia as well.  The patient sister is 70 years old and she has not undergone any treatment for this and she is unclear of the etiology.  The patient's father also reportedly needed periodic phlebotomies. The patient had a brother who passed away at the age of 48 secondary to acute leukemia.  The patient's mother also has a history for stroke, arrhythmia, congestive heart  failure.  Patient's mother has osteoporosis, and passed away from pyelonephritis/sepsis.  Patient had another sister passed away from liver cancer.  The patient has a brother who had colon cancer.  The patient has another sister who had a CVA and fibromyalgia.  The patient also notes that several  of her siblings have hypertension.  The patient used to work as an Environmental health practitioner at [employer].  She is married and does not have any children.  She denies any alcohol drug, or tobacco use.  HPI  Past Medical History:  Diagnosis Date  . Allergic rhinitis from [facility] encounter 06/30/2019  . Anxiety disorder    from [facility] encounter 06/30/2019  . Arthritis of lumbosacral spine   . Asthma   . Asthma    from [facility] encounter 06/30/2019  . Cervical arthritis   . Chest pain   . Colitis 11/25/1999   from [facility] encounter 06/30/2019  . Colitis due to Clostridium difficile 07/14/2003   from [facility] encounter 06/30/2019  . Colon polyp    from [facility] encounter 06/30/2019  . Diabetes (HCC)   . Diarrhea   . Dizzinesses   . Essential hypertension   . GERD (gastroesophageal reflux disease)   . H. pylori infection   . Hx of labyrinthitis 08/14/2006   from [facility] encounter 06/30/2019  . Hyperlipemia   . Hypokalemia 08/04/2003   from [facility] encounter 06/30/2019  . Hypokalemia 08/04/2003  . Hypothyroid   . Menopausal syndrome    from [facility] encounter 06/30/2019  . Neck pain    from [facility] encounter 06/30/2019  . Palpitations   . Preoperative cardiovascular examination   . SNHL (sensorineural hearing loss)    from [facility] encounter 06/30/2019  . Stroke (HCC)   . Unsteady gait    from [facility] encounter 06/30/2019    Past Surgical History:  Procedure Laterality Date  . ALLOGRAFT SPINE MORSELIZED Bilateral 1226/2017   from [facility] encounter 06/30/2019, DrDavid Prior  . APPENDECTOMY    . CATARACT EXTRACTION    . CHOLECYSTECTOMY    . COLONOSCOPY    . HYSTEROSCOPY WITH SALPINGOGRAM    . TONSILLECTOMY    . UPPER GASTROINTESTINAL ENDOSCOPY      Family History  Problem Relation Age of Onset  . Osteoporosis Mother   . Stroke Mother   . Pancreatic cancer Mother   . Hypertension  Father   . Epilepsy Sister   . Hypertension Sister   . Epilepsy Brother   . Colon cancer Brother   . Hypertension Brother   . Liver cancer Sister   . Diabetes Sister   . Hypertension Sister   . Healthy Sister   . Hypertension Sister   . Hypertension Sister   . Hypertension Sister   . Hypertension Sister   . Cirrhosis Sister   . Hepatitis C Sister   . Leukemia Brother     Social History Social History   Tobacco Use  . Smoking status: Never Smoker  . Smokeless tobacco: Never Used  Vaping Use  . Vaping Use: Never used  Substance Use Topics  . Alcohol use: Never  . Drug use: Never    Allergies  Allergen Reactions  . Eggs Or Egg-Derived Products Anaphylaxis and Other (See Comments)  . Adhesive [Tape]   . Asa [Aspirin]   . Asmanex (120 Metered Doses) [Mometasone Furoate]   . Biaxin [Clarithromycin]   . Bonine [Meclizine Hcl]   . Erythromycin   .  Evista [Raloxifene Hcl]   . Influenza Vaccines   . Iodinated Diagnostic Agents   . Nexium [Esomeprazole Magnesium]   . Norco [Hydrocodone-Acetaminophen]   . Oxycodone   . Prilosec [Omeprazole]   . Qvar [Beclomethasone]   . Tessalon [Benzonatate]     Current Outpatient Medications  Medication Sig Dispense Refill  . acetaminophen (TYLENOL) 500 MG tablet Take 500 mg by mouth every 6 (six) hours as needed for mild pain or moderate pain.    . Albuterol Sulfate 108 (90 Base) MCG/ACT AEPB Inhale into the lungs. 2 puffs every 6 hours.    Marland Kitchen amitriptyline (ELAVIL) 10 MG tablet Take 10 mg by mouth at bedtime.    Marland Kitchen amLODipine (NORVASC) 5 MG tablet Take 5 mg by mouth daily. Patient takes 1/2 in the morning and 1/2 at supper.    . Butalbital-APAP-Caff-Cod 50-300-40-30 MG CAPS Take by mouth as needed.    . Ca Carbonate-Mag Hydroxide (ROLAIDS PO) Take by mouth. Patient states that she uses 2-3 times weekly.    . Cyanocobalamin (VITAMIN B-12 IJ) Inject as directed every 21 ( twenty-one) days.     Marland Kitchen denosumab (PROLIA) 60 MG/ML SOSY  injection Inject 60 mg into the skin every 6 (six) months.    . diazepam (VALIUM) 2 MG tablet Take 2 mg by mouth. Patient takes as needed for dizziness.    . diphenhydrAMINE (BENADRYL) 25 MG tablet Take 25 mg by mouth at bedtime.    . ergocalciferol (VITAMIN D2) 1.25 MG (50000 UT) capsule Take 50,000 Units by mouth once a week. Patient states that she takes 1 by mouth twice a week.    . fluticasone furoate-vilanterol (BREO ELLIPTA) 200-25 MCG/INH AEPB Inhale 1 puff into the lungs daily. Patient uses the 100/25 - 1 puff every morning.    Marland Kitchen LEVOTHYROXINE SODIUM PO Take 25 mcg by mouth.    . meclizine (ANTIVERT) 12.5 MG tablet Take 12.5 mg by mouth 3 (three) times daily as needed for dizziness.    Marland Kitchen METOPROLOL TARTRATE PO Take by mouth. 12.5 mg in the morning and 12.5 mg in at night.    Marland Kitchen omeprazole (PRILOSEC) 20 MG capsule Take 20 mg by mouth as needed.    . pravastatin (PRAVACHOL) 10 MG tablet Take 10 mg by mouth daily.    Marland Kitchen saccharomyces boulardii (FLORASTOR) 250 MG capsule Take 250 mg by mouth 2 (two) times daily.    . Wheat Dextrin (BENEFIBER ON THE GO PO) Take 1 packet by mouth. 3/3.5 gram packet daily from [facility] encounter 06/30/2019     No current facility-administered medications for this visit.    Review of Systems REVIEW OF SYSTEMS:   Review of Systems  Constitutional: Negative for appetite change, chills, fatigue, fever and unexpected weight change.  HENT: Positive for intermittent trouble swallowing. Negative for mouth sores, nosebleeds, and sore throat.  Eyes: Negative for eye problems and icterus.  Respiratory: Negative for cough, hemoptysis, shortness of breath and wheezing.   Cardiovascular: Negative for chest pain and leg swelling.  Gastrointestinal: Positive for intermittent abdominal pain in LUQ and suprapubic area. Positive for diarrhea, nausea, vomiting, and constipation.  Genitourinary: Negative for bladder incontinence, difficulty urinating, dysuria, frequency  and hematuria.   Musculoskeletal: Negative for back pain, gait problem, neck pain and neck stiffness.  Skin: Negative for itching and rash.  Neurological: Negative for dizziness, extremity weakness, gait problem, headaches, light-headedness and seizures.  Hematological: Negative for adenopathy. Does not bruise/bleed easily.  Psychiatric/Behavioral: Negative for confusion, depression and  sleep disturbance. The patient is not nervous/anxious.     PHYSICAL EXAMINATION:  Blood pressure 138/77, pulse 70, temperature (!) 97.5 F (36.4 C), temperature source Temporal, resp. rate 18, height 5\' 2"  (1.575 m), weight 228 lb 9.6 oz (103.7 kg), SpO2 99 %.  ECOG PERFORMANCE STATUS: 1  Physical Exam  Constitutional: Oriented to person, place, and time and well-developed, well-nourished, and in no distress.   HENT:  Head: Normocephalic and atraumatic.  Mouth/Throat: Oropharynx is clear and moist. No oropharyngeal exudate.  Eyes: Conjunctivae are normal. Right eye exhibits no discharge. Left eye exhibits no discharge. No scleral icterus.  Neck: Normal range of motion. Neck supple.  Cardiovascular: Normal rate, regular rhythm, normal heart sounds and intact distal pulses.   Pulmonary/Chest: Effort normal and breath sounds normal. No respiratory distress. No wheezes. No rales.  Abdominal: Soft. Tenderness to light palpation in the RUQ/RLQ. (recent CT from 6/4 showed no clear etiology of abdominal pain. Patient status post appendectomy, cholecystectomy, and hysterectomy). bowel sounds are normal. Exhibits no distension and no mass.  Musculoskeletal: Normal range of motion. Exhibits no edema.  Lymphadenopathy:    No cervical adenopathy.  Neurological: Alert and oriented to person, place, and time. Exhibits normal muscle tone. Gait normal. Coordination normal.  Skin: Skin is warm and dry. No rash noted. Not diaphoretic. No erythema. No pallor.  Psychiatric: Mood, memory and judgment normal.  Vitals  reviewed.  LABORATORY DATA: Lab Results  Component Value Date   WBC 6.0 08/25/2019   HGB 15.6 (H) 08/25/2019   HCT 47.4 (H) 08/25/2019   MCV 90.5 08/25/2019   PLT 204 08/25/2019      Chemistry      Component Value Date/Time   NA 138 08/25/2019 1245   K 4.1 08/25/2019 1245   CL 102 08/25/2019 1245   CO2 25 08/25/2019 1245   BUN 14 08/25/2019 1245   CREATININE 0.87 08/25/2019 1245   CREATININE 0.80 01/02/2019 1329      Component Value Date/Time   CALCIUM 10.2 08/25/2019 1245   ALKPHOS 64 08/25/2019 1245   AST 20 08/25/2019 1245   ALT 18 08/25/2019 1245   BILITOT 0.6 08/25/2019 1245       RADIOGRAPHIC STUDIES: CT ABDOMEN PELVIS WO CONTRAST  Result Date: 08/17/2019 CLINICAL DATA:  Abdominal pain, nausea/vomiting, hematochezia, diarrhea. Prior cholecystectomy, appendectomy, and hysterectomy. EXAM: CT ABDOMEN AND PELVIS WITHOUT CONTRAST TECHNIQUE: Multidetector CT imaging of the abdomen and pelvis was performed following the standard protocol without IV contrast. COMPARISON:  None. FINDINGS: Lower chest: Mild subpleural reticulation/fibrosis in the bilateral lower lobes. Hepatobiliary: Mild geographic hepatic steatosis. Status post cholecystectomy. No intrahepatic or extrahepatic ductal dilatation. Pancreas: Within normal limits. Spleen: Within normal limits. Adrenals/Urinary Tract: Adrenal glands are within normal limits. Kidneys are within normal limits. No renal, ureteral, or bladder calculi. No hydronephrosis. Bladder is within normal limits. Stomach/Bowel: Stomach is within normal limits. No evidence of bowel obstruction. Prior appendectomy. Appendiceal stump is within normal limits (series 2/image 65). No colonic wall thickening or inflammatory changes. No colonic mass is evident on CT. Vascular/Lymphatic: No evidence of abdominal aortic aneurysm. Atherosclerotic calcifications of the abdominal aorta and branch vessels. No suspicious abdominopelvic lymphadenopathy. Reproductive:  Status post hysterectomy. No adnexal masses. Other: No abdominopelvic ascites. Musculoskeletal: Mild degenerative changes at L5-S1. IMPRESSION: No CT findings to account for the patient's chronic abdominal pain. Status post cholecystectomy, appendectomy, and hysterectomy. Mild geographic hepatic steatosis. Electronically Signed   By: Charline BillsSriyesh  Krishnan M.D.   On: 08/17/2019 08:51  ASSESSMENT: This is a very pleasant 64 year old Caucasian female referred to the clinic for evaluation of polycythemia.    PLAN: The patient was seen with Dr. Arbutus Ped today.  The patient had several lab studies performed to further evaluate her condition.  She had repeat CBC, CMP, LDH, JAK2 mutation testing, hemochromatosis, erythropoietin, iron studies, and ferritin.   Her labs continue to show a slightly elevated hemoglobin of 15.6.  The patient CMP and LDH were unremarkable.  The patient's ferritin is elevated at 455.  Her iron studies are within normal limits except her total iron is slightly elevated at 163.  Her JAK2 mutation testing for polycythemia vera is still pending at this time.  We will check hemochromatosis due to his elevated iron and ferritin.   We will see the patient back for follow-up visit in 2 weeks for evaluation and to review the results of the rest of her lab studies.   The patient voices understanding of current disease status and treatment options and is in agreement with the current care plan.  All questions were answered. The patient knows to call the clinic with any problems, questions or concerns. We can certainly see the patient much sooner if necessary.  Thank you so much for allowing me to participate in the care of Ninel Abdella. I will continue to follow up the patient with you and assist in her care.  The total time spent in the appointment was 60 minutes.  Disclaimer: This note was dictated with voice recognition software. Similar sounding words can inadvertently be transcribed and  may not be corrected upon review.   Teosha Casso L Becker Christopher August 25, 2019, 4:48 PM   ADDENDUM: Hematology/Oncology Attending: I had a face-to-face encounter with the patient today.  I recommended her care plan.  This is a very pleasant 64 years old white female presented for evaluation of polycythemia.  The patient mentions that her father had a similar problem when he was donating blood at regular basis. She was recently evaluated by gastroenterology for abdominal pain with burning bowel movement.  She also has a history of asthma.  During her evaluation she was found to have persistent elevation of the red blood cells as well as hemoglobin and hematocrit.  Her ferritin level was elevated at 530. I had a lengthy discussion with the patient and her husband today about her condition and further investigation to rule out any underlying bone marrow abnormality. I order several studies today including repeat CBC, comprehensive metabolic panel, erythropoietin, LDH, iron study and ferritin as well as JAK2 mutation and hemochromatosis DNA. We will arrange for the patient to come back for follow-up visit in 2-3 weeks for reevaluation and discussion of her lab results and further recommendation regarding her condition. The patient and her husband agreed to the current plan. She was advised to call immediately if she has any other concerning symptoms in the interval.  Disclaimer: This note was dictated with voice recognition software. Similar sounding words can inadvertently be transcribed and may be missed upon review. Lajuana Matte, MD 08/25/19

## 2019-08-26 LAB — ERYTHROPOIETIN: Erythropoietin: 7.1 m[IU]/mL (ref 2.6–18.5)

## 2019-08-27 ENCOUNTER — Telehealth: Payer: Self-pay | Admitting: Physician Assistant

## 2019-08-27 NOTE — Telephone Encounter (Signed)
Scheduled per los. Called and left msg. Mailed printout  °

## 2019-08-28 LAB — HEMOCHROMATOSIS DNA-PCR(C282Y,H63D)

## 2019-09-03 ENCOUNTER — Telehealth: Payer: Self-pay | Admitting: Gastroenterology

## 2019-09-03 LAB — JAK2 (INCLUDING V617F AND EXON 12), MPL,& CALR-NEXT GEN SEQ

## 2019-09-03 NOTE — Telephone Encounter (Signed)
Pt called to inform that abd pain is getting worse and is becoming unbearable. She would like to know if she can have her proc sooner that 7/23.

## 2019-09-03 NOTE — Telephone Encounter (Signed)
Spoke with patient, pt states that she would like to move her Endo/colon up sooner, pt advised that if abdominal pain gets intolerable she will need to go to the ED because no sooner appts are available for a double procedure. Pt states that she will see if she can hold off and hopes that pain does not get any worse.

## 2019-09-08 ENCOUNTER — Encounter: Payer: Self-pay | Admitting: Internal Medicine

## 2019-09-08 ENCOUNTER — Inpatient Hospital Stay: Payer: Medicare Other

## 2019-09-08 ENCOUNTER — Other Ambulatory Visit: Payer: Self-pay

## 2019-09-08 ENCOUNTER — Inpatient Hospital Stay (HOSPITAL_BASED_OUTPATIENT_CLINIC_OR_DEPARTMENT_OTHER): Payer: Medicare Other | Admitting: Internal Medicine

## 2019-09-08 ENCOUNTER — Other Ambulatory Visit: Payer: Self-pay | Admitting: Medical Oncology

## 2019-09-08 VITALS — BP 123/71 | HR 75 | Temp 97.6°F | Resp 17 | Ht 62.0 in | Wt 232.2 lb

## 2019-09-08 DIAGNOSIS — D751 Secondary polycythemia: Secondary | ICD-10-CM | POA: Diagnosis not present

## 2019-09-08 LAB — CBC WITH DIFFERENTIAL (CANCER CENTER ONLY)
Abs Immature Granulocytes: 0.03 10*3/uL (ref 0.00–0.07)
Basophils Absolute: 0.1 10*3/uL (ref 0.0–0.1)
Basophils Relative: 1 %
Eosinophils Absolute: 0.1 10*3/uL (ref 0.0–0.5)
Eosinophils Relative: 2 %
HCT: 44.4 % (ref 36.0–46.0)
Hemoglobin: 14.8 g/dL (ref 12.0–15.0)
Immature Granulocytes: 0 %
Lymphocytes Relative: 25 %
Lymphs Abs: 2 10*3/uL (ref 0.7–4.0)
MCH: 29.7 pg (ref 26.0–34.0)
MCHC: 33.3 g/dL (ref 30.0–36.0)
MCV: 89 fL (ref 80.0–100.0)
Monocytes Absolute: 0.7 10*3/uL (ref 0.1–1.0)
Monocytes Relative: 9 %
Neutro Abs: 5 10*3/uL (ref 1.7–7.7)
Neutrophils Relative %: 63 %
Platelet Count: 190 10*3/uL (ref 150–400)
RBC: 4.99 MIL/uL (ref 3.87–5.11)
RDW: 12.9 % (ref 11.5–15.5)
WBC Count: 8 10*3/uL (ref 4.0–10.5)
nRBC: 0 % (ref 0.0–0.2)

## 2019-09-08 NOTE — Progress Notes (Signed)
[facility] [phone]   [phone]  OFFICE PROGRESS NOTE  [name], MD [address] 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  She has negative JAK2 mutation panel.  PRIOR THERAPY: None  CURRENT THERAPY: Phlebotomy on as-needed basis.  INTERVAL HISTORY: Laurie Finley 64 y.o. female returns to the clinic today for follow-up visit accompanied by her husband.  The patient continues to complain of increasing fatigue and weakness.  She is scheduled to have a colonoscopy in 3 weeks.  She denied having any current chest pain, shortness of breath except with exertion with no cough or hemoptysis.  She denied having any fever or chills.  She continues to have intermittent nausea with no vomiting, diarrhea or constipation.  She also continues to have intermittent abdominal pain but her last CT scan of the chest was unremarkable.  The patient had several studies for evaluation of her polycythemia and the JAK2 mutation panel was negative but the patient was found to have positive DNA test for hemochromatosis.  She is here today for evaluation and discussion of her treatment options.  MEDICAL HISTORY: Past Medical History:  Diagnosis Date  . Allergic rhinitis from [facility] encounter 06/30/2019  . Anxiety disorder    from [facility] encounter 06/30/2019  . Arthritis of lumbosacral spine   . Asthma   . Asthma    from [facility] encounter 06/30/2019  . Cervical arthritis   . Chest pain   . Colitis 11/25/1999   from [facility] encounter 06/30/2019  . Colitis due to Clostridium difficile 07/14/2003   from [facility] encounter 06/30/2019  . Colon polyp    from [facility] encounter 06/30/2019  . Diabetes (HCC)   . Diarrhea   . Dizzinesses   . Essential hypertension   . GERD (gastroesophageal reflux  disease)   . H. pylori infection   . Hx of labyrinthitis 08/14/2006   from [facility] encounter 06/30/2019  . Hyperlipemia   . Hypokalemia 08/04/2003   from [facility] encounter 06/30/2019  . Hypokalemia 08/04/2003  . Hypothyroid   . Menopausal syndrome    from [facility] encounter 06/30/2019  . Neck pain    from [facility] encounter 06/30/2019  . Palpitations   . Preoperative cardiovascular examination   . SNHL (sensorineural hearing loss)    from [facility] encounter 06/30/2019  . Stroke (HCC)   . Unsteady gait    from [facility] encounter 06/30/2019    ALLERGIES:  is allergic to eggs or egg-derived products, adhesive [tape], asa [aspirin], asmanex (120 metered doses) [mometasone furoate], biaxin [clarithromycin], bonine [meclizine hcl], erythromycin, evista [raloxifene hcl], influenza vaccines, iodinated diagnostic agents, nexium [esomeprazole magnesium], norco [hydrocodone-acetaminophen], oxycodone, prilosec [omeprazole], qvar [beclomethasone], and tessalon [benzonatate].  MEDICATIONS:  Current Outpatient Medications  Medication Sig Dispense Refill  . acetaminophen (TYLENOL) 500 MG tablet Take 500 mg by mouth every 6 (six) hours as needed for mild pain or moderate pain.    . Albuterol Sulfate 108 (90 Base) MCG/ACT AEPB Inhale into the lungs. 2 puffs every 6 hours.    Marland Kitchen amitriptyline (ELAVIL) 10 MG tablet Take 10 mg by mouth at bedtime.    Marland Kitchen amLODipine (NORVASC) 5 MG tablet Take 5 mg by mouth daily. Patient takes 1/2 in the morning and 1/2 at supper.    . Butalbital-APAP-Caff-Cod 50-300-40-30 MG CAPS Take by mouth  as needed.    . Ca Carbonate-Mag Hydroxide (ROLAIDS PO) Take by mouth. Patient states that she uses 2-3 times weekly.    . Cyanocobalamin (VITAMIN B-12 IJ) Inject as directed every 21 ( twenty-one) days.     Marland Kitchen denosumab (PROLIA) 60 MG/ML SOSY injection Inject 60 mg into the skin every 6 (six) months.    . diazepam (VALIUM) 2 MG tablet Take 2 mg by  mouth. Patient takes as needed for dizziness.    . diphenhydrAMINE (BENADRYL) 25 MG tablet Take 25 mg by mouth at bedtime.    . ergocalciferol (VITAMIN D2) 1.25 MG (50000 UT) capsule Take 50,000 Units by mouth once a week. Patient states that she takes 1 by mouth twice a week.    . fluticasone furoate-vilanterol (BREO ELLIPTA) 200-25 MCG/INH AEPB Inhale 1 puff into the lungs daily. Patient uses the 100/25 - 1 puff every morning.    Marland Kitchen LEVOTHYROXINE SODIUM PO Take 25 mcg by mouth.    . meclizine (ANTIVERT) 12.5 MG tablet Take 12.5 mg by mouth 3 (three) times daily as needed for dizziness.    Marland Kitchen METOPROLOL TARTRATE PO Take by mouth. 12.5 mg in the morning and 12.5 mg in at night.    Marland Kitchen omeprazole (PRILOSEC) 20 MG capsule Take 20 mg by mouth as needed.    . pravastatin (PRAVACHOL) 10 MG tablet Take 10 mg by mouth daily.    Marland Kitchen saccharomyces boulardii (FLORASTOR) 250 MG capsule Take 250 mg by mouth 2 (two) times daily.    . Wheat Dextrin (BENEFIBER ON THE GO PO) Take 1 packet by mouth. 3/3.5 gram packet daily from [facility] encounter 06/30/2019     No current facility-administered medications for this visit.    SURGICAL HISTORY:  Past Surgical History:  Procedure Laterality Date  . ALLOGRAFT SPINE MORSELIZED Bilateral 1226/2017   from [facility] encounter 06/30/2019, DrDavid Prior  . APPENDECTOMY    . CATARACT EXTRACTION    . CHOLECYSTECTOMY    . COLONOSCOPY    . HYSTEROSCOPY WITH SALPINGOGRAM    . TONSILLECTOMY    . UPPER GASTROINTESTINAL ENDOSCOPY      REVIEW OF SYSTEMS:  Constitutional: positive for anorexia and fatigue Eyes: negative Ears, nose, mouth, throat, and face: negative Respiratory: positive for dyspnea on exertion Cardiovascular: negative Gastrointestinal: positive for abdominal pain and nausea Genitourinary:negative Integument/breast: negative Hematologic/lymphatic: negative Musculoskeletal:negative Neurological: negative Behavioral/Psych: negative Endocrine:  negative Allergic/Immunologic: negative   PHYSICAL EXAMINATION: General appearance: alert, cooperative, fatigued and no distress Head: Normocephalic, without obvious abnormality, atraumatic Neck: no adenopathy, no JVD, supple, symmetrical, trachea midline and thyroid not enlarged, symmetric, no tenderness/mass/nodules Lymph nodes: Cervical, supraclavicular, and axillary nodes normal. Resp: clear to auscultation bilaterally Back: symmetric, no curvature. ROM normal. No CVA tenderness. Cardio: regular rate and rhythm, S1, S2 normal, no murmur, click, rub or gallop GI: soft, non-tender; bowel sounds normal; no masses,  no organomegaly Extremities: extremities normal, atraumatic, no cyanosis or edema Neurologic: Alert and oriented X 3, normal strength and tone. Normal symmetric reflexes. Normal coordination and gait  ECOG PERFORMANCE STATUS: 1 - Symptomatic but completely ambulatory  Blood pressure 123/71, pulse 75, temperature 97.6 F (36.4 C), temperature source Temporal, resp. rate 17, height 5\' 2"  (1.575 m), weight 232 lb 3.2 oz (105.3 kg), SpO2 98 %.  LABORATORY DATA: Lab Results  Component Value Date   WBC 6.0 08/25/2019   HGB 15.6 (H) 08/25/2019   HCT 47.4 (H) 08/25/2019   MCV 90.5 08/25/2019   PLT 204 08/25/2019  Chemistry      Component Value Date/Time   NA 138 08/25/2019 1245   K 4.1 08/25/2019 1245   CL 102 08/25/2019 1245   CO2 25 08/25/2019 1245   BUN 14 08/25/2019 1245   CREATININE 0.87 08/25/2019 1245   CREATININE 0.80 01/02/2019 1329      Component Value Date/Time   CALCIUM 10.2 08/25/2019 1245   ALKPHOS 64 08/25/2019 1245   AST 20 08/25/2019 1245   ALT 18 08/25/2019 1245   BILITOT 0.6 08/25/2019 1245       RADIOGRAPHIC STUDIES: CT ABDOMEN PELVIS WO CONTRAST  Result Date: 08/17/2019 CLINICAL DATA:  Abdominal pain, nausea/vomiting, hematochezia, diarrhea. Prior cholecystectomy, appendectomy, and hysterectomy. EXAM: CT ABDOMEN AND PELVIS WITHOUT  CONTRAST TECHNIQUE: Multidetector CT imaging of the abdomen and pelvis was performed following the standard protocol without IV contrast. COMPARISON:  None. FINDINGS: Lower chest: Mild subpleural reticulation/fibrosis in the bilateral lower lobes. Hepatobiliary: Mild geographic hepatic steatosis. Status post cholecystectomy. No intrahepatic or extrahepatic ductal dilatation. Pancreas: Within normal limits. Spleen: Within normal limits. Adrenals/Urinary Tract: Adrenal glands are within normal limits. Kidneys are within normal limits. No renal, ureteral, or bladder calculi. No hydronephrosis. Bladder is within normal limits. Stomach/Bowel: Stomach is within normal limits. No evidence of bowel obstruction. Prior appendectomy. Appendiceal stump is within normal limits (series 2/image 65). No colonic wall thickening or inflammatory changes. No colonic mass is evident on CT. Vascular/Lymphatic: No evidence of abdominal aortic aneurysm. Atherosclerotic calcifications of the abdominal aorta and branch vessels. No suspicious abdominopelvic lymphadenopathy. Reproductive: Status post hysterectomy. No adnexal masses. Other: No abdominopelvic ascites. Musculoskeletal: Mild degenerative changes at L5-S1. IMPRESSION: No CT findings to account for the patient's chronic abdominal pain. Status post cholecystectomy, appendectomy, and hysterectomy. Mild geographic hepatic steatosis. Electronically Signed   By: Julian Hy M.D.   On: 08/17/2019 08:51    ASSESSMENT AND PLAN: This is a very pleasant 65 years old white female recently diagnosed with hemochromatosis with DNA mutation and C282Y and H63D.  The patient continues to complain of increasing fatigue and weakness as well as intermittent abdominal pain and nausea. I had a lengthy discussion with the patient and her husband about her current condition and treatment options. Her Jak 2 mutation panel was unremarkable. I recommended for the patient to proceed with phlebotomy  on weekly basis for the next few weeks until her ferritin level is less than 50. I also strongly advised the patient to avoid any high iron diet including red meat, liver or other supplements. I will see her back for follow-up visit in 1 months for evaluation and repeat CBC, comprehensive metabolic panel, iron study and ferritin. For the abdominal pain and intermittent nausea, she is scheduled to see gastroenterology for colonoscopy in few weeks. She was advised to call immediately if she has any concerning symptoms in the interval. The patient voices understanding of current disease status and treatment options and is in agreement with the current care plan.  All questions were answered. The patient knows to call the clinic with any problems, questions or concerns. We can certainly see the patient much sooner if necessary.  The total time spent in the appointment was 30 minutes.  Disclaimer: This note was dictated with voice recognition software. Similar sounding words can inadvertently be transcribed and may not be corrected upon review.

## 2019-09-12 ENCOUNTER — Inpatient Hospital Stay: Payer: Medicare Other | Attending: Physician Assistant

## 2019-09-12 ENCOUNTER — Other Ambulatory Visit: Payer: Self-pay

## 2019-09-12 DIAGNOSIS — R5383 Other fatigue: Secondary | ICD-10-CM | POA: Insufficient documentation

## 2019-09-12 DIAGNOSIS — K209 Esophagitis, unspecified without bleeding: Secondary | ICD-10-CM | POA: Diagnosis not present

## 2019-09-12 DIAGNOSIS — R110 Nausea: Secondary | ICD-10-CM | POA: Insufficient documentation

## 2019-09-12 DIAGNOSIS — R531 Weakness: Secondary | ICD-10-CM | POA: Insufficient documentation

## 2019-09-12 DIAGNOSIS — R109 Unspecified abdominal pain: Secondary | ICD-10-CM | POA: Diagnosis not present

## 2019-09-12 DIAGNOSIS — Z90710 Acquired absence of both cervix and uterus: Secondary | ICD-10-CM | POA: Insufficient documentation

## 2019-09-12 DIAGNOSIS — D751 Secondary polycythemia: Secondary | ICD-10-CM | POA: Insufficient documentation

## 2019-09-12 NOTE — Patient Instructions (Signed)

## 2019-09-12 NOTE — Progress Notes (Signed)
1st time phlebotomy with kit to right [facility]. Started at 9:37 and ended at 9:42 withdrew 520. Tolerated well. Provided liquids.

## 2019-09-20 ENCOUNTER — Inpatient Hospital Stay: Payer: Medicare Other

## 2019-09-20 ENCOUNTER — Other Ambulatory Visit: Payer: Self-pay

## 2019-09-20 NOTE — Progress Notes (Signed)
Laurie Finley presents today for phlebotomy per MD orders. Phlebotomy procedure started at 1112 via 16g phlebotomy kit and ended at 1118.  522 grams removed.  Patient observed for 30 minutes after procedure without any incident.  Patient tolerated procedure well.  IV needle removed intact.  Snack and liquids provided.

## 2019-09-20 NOTE — Patient Instructions (Signed)

## 2019-09-26 ENCOUNTER — Other Ambulatory Visit: Payer: Self-pay

## 2019-09-26 ENCOUNTER — Inpatient Hospital Stay: Payer: Medicare Other

## 2019-09-26 NOTE — Patient Instructions (Signed)

## 2019-09-26 NOTE — Progress Notes (Signed)
Laurie Finley presents today for phlebotomy per MD Arbutus Ped orders (every week per MD note). Phlebotomy procedure started at 0940 and ended at 0954. 525 grams removed. 18 G RAC needle removed intact at 0954. At 0955 pt reports feeling nauseated & weak, skin pale/clammy. MD Arbutus Ped made aware, given VO to administer IVF if needed.  Pt placed in reclining/modified Tendelenberg position, VS taken (stable), given food/drink.  Pt reports feeling better within 5 minutes.  No IVF administered. Patient observed for 30 minutes after procedure.  VS stable, reports feeling much better at end of tx, able to ambulate w/belongings and wheeled walker to exit with belongings, aware to f/u as needed.

## 2019-10-01 ENCOUNTER — Telehealth: Payer: Self-pay

## 2019-10-01 ENCOUNTER — Telehealth: Payer: Self-pay | Admitting: Gastroenterology

## 2019-10-01 ENCOUNTER — Inpatient Hospital Stay: Payer: Medicare Other

## 2019-10-01 ENCOUNTER — Other Ambulatory Visit: Payer: Self-pay

## 2019-10-01 NOTE — Telephone Encounter (Signed)
Dr Lavon Paganini Are there any contraindications to her having her EGD/colon on 10/03/19. She was phlebotomized today for the treatment of hemochromatosis. That note is in the chart also. Thanks

## 2019-10-01 NOTE — Patient Instructions (Signed)

## 2019-10-01 NOTE — Progress Notes (Signed)
Laurie Finley presents today for phlebotomy per MD orders. Phlebotomy procedure started at 1338 and ended at 1420 using a 18 G IV needle and secondary tubing. A total of 350 grams were removed until the catheter clotted off. Attempt was made to flush IV, but was unsuccessful. PIV needle removed intact.  Towards end of infusion, patient started to complain of feeling lightheaded and nauseated. Patient positioned in trandelenberg. Patient given PO fluids. Dr. Arbutus Ped made aware. Ok to stop phlebotomy for today. Instruct patient to continue to drink oral fluids. Offered patient PO compazine for nausea. Patient declined, stating she felt better and will take her home nausea medication when she get home  Patient observed for 30 minutes. VSS upon leaving infusion room.

## 2019-10-01 NOTE — Telephone Encounter (Signed)
Duplicated message.

## 2019-10-01 NOTE — Telephone Encounter (Signed)
Its fine to proceed. Thanks 

## 2019-10-02 NOTE — Telephone Encounter (Signed)
Spoke with the patient and her spouse. Advised we will plan on the procedure as scheduled. Discussed importance of hydration and confirmed instructions on her prep.

## 2019-10-03 ENCOUNTER — Ambulatory Visit (AMBULATORY_SURGERY_CENTER): Payer: Medicare Other | Admitting: Gastroenterology

## 2019-10-03 ENCOUNTER — Other Ambulatory Visit: Payer: Self-pay

## 2019-10-03 ENCOUNTER — Encounter: Payer: Self-pay | Admitting: Gastroenterology

## 2019-10-03 VITALS — BP 148/68 | HR 78 | Temp 96.2°F | Resp 12 | Ht 62.0 in | Wt 229.0 lb

## 2019-10-03 DIAGNOSIS — K649 Unspecified hemorrhoids: Secondary | ICD-10-CM | POA: Diagnosis not present

## 2019-10-03 DIAGNOSIS — Z8619 Personal history of other infectious and parasitic diseases: Secondary | ICD-10-CM

## 2019-10-03 DIAGNOSIS — K5730 Diverticulosis of large intestine without perforation or abscess without bleeding: Secondary | ICD-10-CM

## 2019-10-03 DIAGNOSIS — K2100 Gastro-esophageal reflux disease with esophagitis, without bleeding: Secondary | ICD-10-CM

## 2019-10-03 DIAGNOSIS — R197 Diarrhea, unspecified: Secondary | ICD-10-CM | POA: Diagnosis present

## 2019-10-03 DIAGNOSIS — Q400 Congenital hypertrophic pyloric stenosis: Secondary | ICD-10-CM

## 2019-10-03 DIAGNOSIS — R112 Nausea with vomiting, unspecified: Secondary | ICD-10-CM | POA: Diagnosis not present

## 2019-10-03 MED ORDER — SODIUM CHLORIDE 0.9 % IV SOLN: 500.0000 mL | Freq: Once | INTRAVENOUS | Status: DC

## 2019-10-03 MED ORDER — PANTOPRAZOLE SODIUM 40 MG PO TBEC
40.0000 mg | DELAYED_RELEASE_TABLET | Freq: Every day | ORAL | 2 refills | Status: DC
Start: 2019-10-03 — End: 2019-12-15

## 2019-10-03 NOTE — Progress Notes (Signed)
Called to room to assist during endoscopic procedure.  Patient ID and intended procedure confirmed with present staff. Received instructions for my participation in the procedure from the performing physician.  

## 2019-10-03 NOTE — Op Note (Signed)
[facility] Patient Name: Laurie Finley Procedure Date: 10/03/2019 10:52 AM MRN: [id number] Endoscopist: Laurie Finley , MD Age: 64 Referring MD:  Date of Birth: [birth year] Gender: Female Account #: [id number] Procedure:                Colonoscopy Indications:              Clinically significant diarrhea of unexplained                            origin Medicines:                Monitored Anesthesia Care Procedure:                Pre-Anesthesia Assessment:                           - Prior to the procedure, a History and Physical                            was performed, and patient medications and                            allergies were reviewed. The patient's tolerance of                            previous anesthesia was also reviewed. The risks                            and benefits of the procedure and the sedation                            options and risks were discussed with the patient.                            All questions were answered, and informed consent                            was obtained. Prior Anticoagulants: The patient has                            taken no previous anticoagulant or antiplatelet                            agents. ASA Grade Assessment: III - A patient with                            severe systemic disease. After reviewing the risks                            and benefits, the patient was deemed in                            satisfactory condition to undergo the procedure.  After obtaining informed consent, the colonoscope                            was passed under direct vision. Throughout the                            procedure, the patient's blood pressure, pulse, and                            oxygen saturations were monitored continuously. The                            Colonoscope was introduced through the anus and                            advanced to the the cecum, identified by                             appendiceal orifice and ileocecal valve. The                            colonoscopy was performed without difficulty. The                            patient tolerated the procedure well. The quality                            of the bowel preparation was excellent. The                            ileocecal valve, appendiceal orifice, and rectum                            were photographed. Scope In: 11:05:23 AM Scope Out: 11:22:33 AM Scope Withdrawal Time: 0 hours 8 minutes 20 seconds  Total Procedure Duration: 0 hours 17 minutes 10 seconds  Findings:                 The perianal and digital rectal examinations were                            normal.                           The colon (entire examined portion) appeared                            normal. Biopsies for histology were taken with a                            cold forceps from the right transverse colon and                            left transverse colon for evaluation of microscopic  colitis.                           A few small-mouthed diverticula were found in the                            sigmoid colon and ascending colon.                           Non-bleeding internal hemorrhoids were found during                            retroflexion. The hemorrhoids were small. Complications:            No immediate complications. Estimated Blood Loss:     Estimated blood loss was minimal. Impression:               - The entire examined colon is normal. Biopsied.                           - Diverticulosis in the sigmoid colon and in the                            ascending colon.                           - Non-bleeding internal hemorrhoids. Recommendation:           - Patient has a contact number available for                            emergencies. The signs and symptoms of potential                            delayed complications were discussed with the                             patient. Return to normal activities tomorrow.                            Written discharge instructions were provided to the                            patient.                           - Resume previous diet.                           - Continue present medications.                           - Await pathology results.                           - Repeat colonoscopy in 10 years for screening  purposes. Laurie FormKavitha V. Rihaan Barrack, MD 10/03/2019 11:30:52 AM This report has been signed electronically.

## 2019-10-03 NOTE — Progress Notes (Signed)
V/S-CW  CHECK-IN-MG  During admission process pt. Questioned extensively concerning allergy to eggs,she stated "eggs make me have hives",she was asked if she eat eggs in cake and she said "yes and that she eat eggs once a week". Pt. Stated that she did have propofol with cataracts surgery and she did fine with it,made CRNA and Dr. Sallyanne Kuster aware of allergy to eggs and reaction prior to procedure.

## 2019-10-03 NOTE — Op Note (Signed)
[facility] Patient Name: Laurie Finley Procedure Date: 7/[place]2021 10:53 AM MRN: [id number] Endoscopist: Napoleon FormKavitha V. Nissim Fleischer , MD Age: [age] Referring MD:  Date of Birth: 04-15-1955 Gender: Female Account #: [id number] Procedure:                Upper GI endoscopy Indications:              Persistent vomiting of unknown cause, Follow-up of                            Helicobacter pylori Medicines:                Monitored Anesthesia Care Procedure:                Pre-Anesthesia Assessment:                           - Prior to the procedure, a History and Physical                            was performed, and patient medications and                            allergies were reviewed. The patient's tolerance of                            previous anesthesia was also reviewed. The risks                            and benefits of the procedure and the sedation                            options and risks were discussed with the patient.                            All questions were answered, and informed consent                            was obtained. Prior Anticoagulants: The patient has                            taken no previous anticoagulant or antiplatelet                            agents. ASA Grade Assessment: III - A patient with                            severe systemic disease. After reviewing the risks                            and benefits, the patient was deemed in                            satisfactory condition to undergo the procedure.  After obtaining informed consent, the endoscope was                            passed under direct vision. Throughout the                            procedure, the patient's blood pressure, pulse, and                            oxygen saturations were monitored continuously. The                            Endoscope was introduced through the mouth, and                            advanced to the second part  of duodenum. The upper                            GI endoscopy was accomplished without difficulty.                            The patient tolerated the procedure well. Scope In: Scope Out: Findings:                 LA Grade B (one or more mucosal breaks greater than                            5 mm, not extending between the tops of two mucosal                            folds) esophagitis was found 35 to 36 cm from the                            incisors.                           The Z-line was regular and was found 36 cm from the                            incisors.                           The gastroesophageal flap valve was visualized                            endoscopically and classified as Hill Grade III                            (minimal fold, loose to endoscope, hiatal hernia                            likely).                           The entire examined stomach  was normal. Biopsies                            were taken with a cold forceps for Helicobacter                            pylori testing using CLOtest.                           The cardia and gastric fundus were normal on                            retroflexion.                           The examined duodenum was normal. Complications:            No immediate complications. Estimated Blood Loss:     Estimated blood loss was minimal. Impression:               - LA Grade B reflux esophagitis.                           - Z-line regular, 36 cm from the incisors.                           - Gastroesophageal flap valve classified as Hill                            Grade III (minimal fold, loose to endoscope, hiatal                            hernia likely).                           - Normal stomach. Biopsied.                           - Normal examined duodenum. Recommendation:           - Patient has a contact number available for                            emergencies. The signs and symptoms of potential                             delayed complications were discussed with the                            patient. Return to normal activities tomorrow.                            Written discharge instructions were provided to the                            patient.                           -  Resume previous diet.                           - Continue present medications.                           - Await pathology results.                           - Follow an antireflux regimen.                           - Use Protonix (pantoprazole) 40 mg PO daily for 3                            months. Napoleon Form, MD 10/03/2019 11:28:34 AM This report has been signed electronically.

## 2019-10-03 NOTE — Progress Notes (Signed)
liocaine 100 mg iv given to reduce gag reflex 

## 2019-10-03 NOTE — Patient Instructions (Signed)
YOU HAD AN ENDOSCOPIC PROCEDURE TODAY AT THE [facility]:   Refer to the procedure report that was given to you for any specific questions about what was found during the examination.  If the procedure report does not answer your questions, please call your gastroenterologist to clarify.  If you requested that your care partner not be given the details of your procedure findings, then the procedure report has been included in a sealed envelope for you to review at your convenience later.  YOU SHOULD EXPECT: Some feelings of bloating in the abdomen. Passage of more gas than usual.  Walking can help get rid of the air that was put into your GI tract during the procedure and reduce the bloating. If you had a lower endoscopy (such as a colonoscopy or flexible sigmoidoscopy) you may notice spotting of blood in your stool or on the toilet paper. If you underwent a bowel prep for your procedure, you may not have a normal bowel movement for a few days.  Please Note:  You might notice some irritation and congestion in your nose or some drainage.  This is from the oxygen used during your procedure.  There is no need for concern and it should clear up in a day or so.  SYMPTOMS TO REPORT IMMEDIATELY:   Following lower endoscopy (colonoscopy or flexible sigmoidoscopy):  Excessive amounts of blood in the stool  Significant tenderness or worsening of abdominal pains  Swelling of the abdomen that is new, acute  Fever of 100F or higher   Following upper endoscopy (EGD)  Vomiting of blood or coffee ground material  New chest pain or pain under the shoulder blades  Painful or persistently difficult swallowing  New shortness of breath  Fever of 100F or higher  Black, tarry-looking stools  For urgent or emergent issues, a gastroenterologist can be reached at any hour by calling [phone]. Do not use MyChart messaging for urgent concerns.    DIET:  We do recommend a small meal at first, but  then you may proceed to your regular diet.  Drink plenty of fluids but you should avoid alcoholic beverages for 24 hours.  ACTIVITY:  You should plan to take it easy for the rest of today and you should NOT DRIVE or use heavy machinery until tomorrow (because of the sedation medicines used during the test).    FOLLOW UP: Our staff will call the number listed on your records 48-72 hours following your procedure to check on you and address any questions or concerns that you may have regarding the information given to you following your procedure. If we do not reach you, we will leave a message.  We will attempt to reach you two times.  During this call, we will ask if you have developed any symptoms of COVID 19. If you develop any symptoms (ie: fever, flu-like symptoms, shortness of breath, cough etc.) before then, please call [phone].  If you test positive for Covid 19 in the 2 weeks post procedure, please call and report this information to us.    If any biopsies were taken you will be contacted by phone or by letter within the next 1-3 weeks.  Please call us at [phone] if you have not heard about the biopsies in 3 weeks.    SIGNATURES/CONFIDENTIALITY: You and/or your care partner have signed paperwork which will be entered into your electronic medical record.  These signatures attest to the fact that that the information above on   your After Visit Summary has been reviewed and is understood.  Full responsibility of the confidentiality of this discharge information lies with you and/or your care-partner. 

## 2019-10-03 NOTE — Progress Notes (Signed)
Pt Drowsy. VSS. To PACU, report to RN. No anesthetic complications noted.  

## 2019-10-06 ENCOUNTER — Inpatient Hospital Stay: Payer: Medicare Other

## 2019-10-06 ENCOUNTER — Other Ambulatory Visit: Payer: Self-pay

## 2019-10-06 ENCOUNTER — Inpatient Hospital Stay (HOSPITAL_BASED_OUTPATIENT_CLINIC_OR_DEPARTMENT_OTHER): Payer: Medicare Other | Admitting: Internal Medicine

## 2019-10-06 ENCOUNTER — Encounter: Payer: Self-pay | Admitting: Internal Medicine

## 2019-10-06 DIAGNOSIS — D751 Secondary polycythemia: Secondary | ICD-10-CM | POA: Diagnosis not present

## 2019-10-06 DIAGNOSIS — R5383 Other fatigue: Secondary | ICD-10-CM | POA: Diagnosis not present

## 2019-10-06 DIAGNOSIS — K209 Esophagitis, unspecified without bleeding: Secondary | ICD-10-CM

## 2019-10-06 DIAGNOSIS — R531 Weakness: Secondary | ICD-10-CM

## 2019-10-06 DIAGNOSIS — R109 Unspecified abdominal pain: Secondary | ICD-10-CM

## 2019-10-06 DIAGNOSIS — R110 Nausea: Secondary | ICD-10-CM

## 2019-10-06 DIAGNOSIS — Z90710 Acquired absence of both cervix and uterus: Secondary | ICD-10-CM

## 2019-10-06 LAB — CBC WITH DIFFERENTIAL (CANCER CENTER ONLY)
Abs Immature Granulocytes: 0.02 10*3/uL (ref 0.00–0.07)
Basophils Absolute: 0.1 10*3/uL (ref 0.0–0.1)
Basophils Relative: 1 %
Eosinophils Absolute: 0.2 10*3/uL (ref 0.0–0.5)
Eosinophils Relative: 3 %
HCT: 36.3 % (ref 36.0–46.0)
Hemoglobin: 11.9 g/dL — ABNORMAL LOW (ref 12.0–15.0)
Immature Granulocytes: 0 %
Lymphocytes Relative: 30 %
Lymphs Abs: 2 10*3/uL (ref 0.7–4.0)
MCH: 30.3 pg (ref 26.0–34.0)
MCHC: 32.8 g/dL (ref 30.0–36.0)
MCV: 92.4 fL (ref 80.0–100.0)
Monocytes Absolute: 0.5 10*3/uL (ref 0.1–1.0)
Monocytes Relative: 8 %
Neutro Abs: 3.8 10*3/uL (ref 1.7–7.7)
Neutrophils Relative %: 58 %
Platelet Count: 236 10*3/uL (ref 150–400)
RBC: 3.93 MIL/uL (ref 3.87–5.11)
RDW: 14.3 % (ref 11.5–15.5)
WBC Count: 6.5 10*3/uL (ref 4.0–10.5)
nRBC: 0 % (ref 0.0–0.2)

## 2019-10-06 LAB — CMP (CANCER CENTER ONLY)
ALT: 16 U/L (ref 0–44)
AST: 18 U/L (ref 15–41)
Albumin: 3.7 g/dL (ref 3.5–5.0)
Alkaline Phosphatase: 68 U/L (ref 38–126)
Anion gap: 9 (ref 5–15)
BUN: 6 mg/dL — ABNORMAL LOW (ref 8–23)
CO2: 26 mmol/L (ref 22–32)
Calcium: 10.3 mg/dL (ref 8.9–10.3)
Chloride: 106 mmol/L (ref 98–111)
Creatinine: 0.88 mg/dL (ref 0.44–1.00)
GFR, Est AFR Am: >60 mL/min (ref >60)
GFR, Estimated: >60 mL/min (ref >60)
Glucose, Bld: 172 mg/dL — ABNORMAL HIGH (ref 70–99)
Potassium: 3.6 mmol/L (ref 3.5–5.1)
Sodium: 141 mmol/L (ref 135–145)
Total Bilirubin: 0.4 mg/dL (ref 0.3–1.2)
Total Protein: 7 g/dL (ref 6.5–8.1)

## 2019-10-06 LAB — HELICOBACTER PYLORI SCREEN-BIOPSY: UREASE: NEGATIVE

## 2019-10-06 LAB — IRON AND TIBC
Iron: 65 ug/dL (ref 41–142)
Saturation Ratios: 19 % — ABNORMAL LOW (ref 21–57)
TIBC: 338 ug/dL (ref 236–444)
UIBC: 272 ug/dL (ref 120–384)

## 2019-10-06 LAB — FERRITIN: Ferritin: 147 ng/mL (ref 11–307)

## 2019-10-06 NOTE — Progress Notes (Signed)
[facility] [phone]   [phone]  OFFICE PROGRESS NOTE  [name], MD [address] 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  She has negative JAK2 mutation panel.  PRIOR THERAPY: None  CURRENT THERAPY: Phlebotomy on as-needed basis.  INTERVAL HISTORY: Laurie Finley 64 y.o. female returns to the clinic today for follow-up visit.  The patient is feeling fine today with no concerning complaints except for acid reflux.  She was seen by gastroenterologist and was found to have esophagitis and treated with Prilosec.  She denied having any current chest pain, shortness of breath, cough or hemoptysis.  She denied having any fever or chills.  She has no nausea, vomiting, diarrhea or constipation.  She has no headache or visual changes.  She is here today for evaluation with repeat CBC, comprehensive metabolic panel, iron study and ferritin.  MEDICAL HISTORY: Past Medical History:  Diagnosis Date  . Allergic rhinitis from [facility] encounter 06/30/2019  . Allergy    SEASONAL  . Anxiety disorder    from [facility] encounter 06/30/2019  . Arthritis of lumbosacral spine   . Asthma   . Asthma    from [facility] encounter 06/30/2019  . Cataract    BILATERAL-REMOVED  . Cervical arthritis   . Chest pain   . Colitis 11/25/1999   from [facility] encounter 06/30/2019  . Colitis due to Clostridium difficile 07/14/2003   from [facility] encounter 06/30/2019  . Colon polyp    from [facility] encounter 06/30/2019  . Diabetes (HCC)   . Diarrhea   . Dizzinesses   . Essential hypertension   . GERD (gastroesophageal reflux disease)   . H. pylori infection   . Hx of labyrinthitis 08/14/2006   from [facility] encounter 06/30/2019  . Hyperlipemia   . Hypokalemia 08/04/2003   from [facility]  encounter 06/30/2019  . Hypokalemia 08/04/2003  . Hypothyroid   . Menopausal syndrome    from [facility] encounter 06/30/2019  . Neck pain    from [facility] encounter 06/30/2019  . Osteoporosis   . Palpitations   . Preoperative cardiovascular examination   . SNHL (sensorineural hearing loss)    from [facility] encounter 06/30/2019  . Stroke (HCC)   . Unsteady gait    from [facility] encounter 06/30/2019    ALLERGIES:  is allergic to eggs or egg-derived products, adhesive [tape], asa [aspirin], asmanex (120 metered doses) [mometasone furoate], biaxin [clarithromycin], bonine [meclizine hcl], erythromycin, evista [raloxifene hcl], influenza vaccines, iodinated diagnostic agents, nexium [esomeprazole magnesium], norco [hydrocodone-acetaminophen], oxycodone, prilosec [omeprazole], qvar [beclomethasone], and tessalon [benzonatate].  MEDICATIONS:  Current Outpatient Medications  Medication Sig Dispense Refill  . acetaminophen (TYLENOL) 500 MG tablet Take 500 mg by mouth every 6 (six) hours as needed for mild pain or moderate pain.    . Albuterol Sulfate 108 (90 Base) MCG/ACT AEPB Inhale into the lungs. 2 puffs every 6 hours.    Marland Kitchen amitriptyline (ELAVIL) 10 MG tablet Take 10 mg by mouth at bedtime.    Marland Kitchen amLODipine (NORVASC) 5 MG tablet Take 5 mg by mouth daily. Patient takes 1/2 in the morning and 1/2 at supper.    . Butalbital-APAP-Caff-Cod 50-300-40-30 MG CAPS Take by mouth as needed.    . Ca Carbonate-Mag Hydroxide (ROLAIDS PO) Take by mouth. Patient states that she uses 2-3 times weekly.    Marland Kitchen  Cyanocobalamin (VITAMIN B-12 IJ) Inject as directed every 21 ( twenty-one) days.     Marland Kitchen denosumab (PROLIA) 60 MG/ML SOSY injection Inject 60 mg into the skin every 6 (six) months.    . diazepam (VALIUM) 2 MG tablet Take 2 mg by mouth. Patient takes as needed for dizziness.    . diphenhydrAMINE (BENADRYL) 25 MG tablet Take 25 mg by mouth at bedtime.    . ergocalciferol (VITAMIN D2) 1.25 MG  (50000 UT) capsule Take 50,000 Units by mouth once a week. Patient states that she takes 1 by mouth twice a week.    . fluticasone furoate-vilanterol (BREO ELLIPTA) 200-25 MCG/INH AEPB Inhale 1 puff into the lungs daily. Patient uses the 100/25 - 1 puff every morning.    Marland Kitchen LEVOTHYROXINE SODIUM PO Take 25 mcg by mouth.    . meclizine (ANTIVERT) 12.5 MG tablet Take 12.5 mg by mouth 3 (three) times daily as needed for dizziness.    Marland Kitchen METOPROLOL TARTRATE PO Take by mouth. 12.5 mg in the morning and 12.5 mg in at night.    Marland Kitchen omeprazole (PRILOSEC) 20 MG capsule Take 20 mg by mouth as needed.    . pantoprazole (PROTONIX) 40 MG tablet Take 1 tablet (40 mg total) by mouth daily. 30 tablet 2  . pravastatin (PRAVACHOL) 10 MG tablet Take 10 mg by mouth daily.    Marland Kitchen saccharomyces boulardii (FLORASTOR) 250 MG capsule Take 250 mg by mouth 2 (two) times daily.    . Wheat Dextrin (BENEFIBER ON THE GO PO) Take 1 packet by mouth. 3/3.5 gram packet daily from [facility] encounter 06/30/2019     Current Facility-Administered Medications  Medication Dose Route Frequency Provider Last Rate Last Admin  . 0.9 %  sodium chloride infusion  500 mL Intravenous Once Nandigam, Eleonore Chiquito, MD        SURGICAL HISTORY:  Past Surgical History:  Procedure Laterality Date  . ABDOMINAL HYSTERECTOMY    . ALLOGRAFT SPINE MORSELIZED Bilateral 1226/2017   from [facility] encounter 06/30/2019, DrDavid Prior  . APPENDECTOMY    . CATARACT EXTRACTION    . CHOLECYSTECTOMY    . COLONOSCOPY    . HYSTEROSCOPY WITH SALPINGOGRAM    . TONSILLECTOMY    . UPPER GASTROINTESTINAL ENDOSCOPY      REVIEW OF SYSTEMS:  A comprehensive review of systems was negative except for: Gastrointestinal: positive for dyspepsia   PHYSICAL EXAMINATION: General appearance: alert, cooperative, fatigued and no distress Head: Normocephalic, without obvious abnormality, atraumatic Neck: no adenopathy, no JVD, supple, symmetrical, trachea midline and  thyroid not enlarged, symmetric, no tenderness/mass/nodules Lymph nodes: Cervical, supraclavicular, and axillary nodes normal. Resp: clear to auscultation bilaterally Back: symmetric, no curvature. ROM normal. No CVA tenderness. Cardio: regular rate and rhythm, S1, S2 normal, no murmur, click, rub or gallop GI: soft, non-tender; bowel sounds normal; no masses,  no organomegaly Extremities: extremities normal, atraumatic, no cyanosis or edema  ECOG PERFORMANCE STATUS: 1 - Symptomatic but completely ambulatory  Blood pressure 114/72, pulse 68, temperature (!) 97.5 F (36.4 C), temperature source Temporal, resp. rate 17, height 5\' 2"  (1.575 m), weight (!) 224 lb 4.8 oz (101.7 kg), SpO2 98 %.  LABORATORY DATA: Lab Results  Component Value Date   WBC 6.5 10/06/2019   HGB 11.9 (L) 10/06/2019   HCT 36.3 10/06/2019   MCV 92.4 10/06/2019   PLT 236 10/06/2019      Chemistry      Component Value Date/Time   NA 138 08/25/2019 1245  K 4.1 08/25/2019 1245   CL 102 08/25/2019 1245   CO2 25 08/25/2019 1245   BUN 14 08/25/2019 1245   CREATININE 0.87 08/25/2019 1245   CREATININE 0.80 01/02/2019 1329      Component Value Date/Time   CALCIUM 10.2 08/25/2019 1245   ALKPHOS 64 08/25/2019 1245   AST 20 08/25/2019 1245   ALT 18 08/25/2019 1245   BILITOT 0.6 08/25/2019 1245       RADIOGRAPHIC STUDIES: No results found.  ASSESSMENT AND PLAN: This is a very pleasant 64 years old white female recently diagnosed with hemochromatosis with DNA mutation and C282Y and H63D.  The patient continues to complain of increasing fatigue and weakness as well as intermittent abdominal pain and nausea. I had a lengthy discussion with the patient and her husband about her current condition and treatment options. Her Jak 2 mutation panel was unremarkable. I recommended for the patient to proceed with phlebotomy on weekly basis for the next few weeks until her ferritin level is less than 50. I also strongly  advised the patient to avoid any high iron diet including red meat, liver or other supplements. CBC today showed decrease in the hemoglobin down to 11.9.  Ferritin level is still elevated at 147. I will arrange for the patient to have phlebotomy done later this week.  I will see her back for follow-up visit in 1 months for evaluation and repeat blood work and phlebotomy if needed. The patient voices understanding of current disease status and treatment options and is in agreement with the current care plan.  All questions were answered. The patient knows to call the clinic with any problems, questions or concerns. We can certainly see the patient much sooner if necessary.  The total time spent in the appointment was 30 minutes.  Disclaimer: This note was dictated with voice recognition software. Similar sounding words can inadvertently be transcribed and may not be corrected upon review.

## 2019-10-07 ENCOUNTER — Telehealth: Payer: Self-pay

## 2019-10-07 ENCOUNTER — Encounter: Payer: Self-pay | Admitting: Gastroenterology

## 2019-10-07 NOTE — Telephone Encounter (Signed)
°  Follow up Call-  Call back number 10/03/2019  Post procedure Call Back phone  # [phone]  Permission to leave phone message Yes  Some recent data might be hidden     Patient questions:  Do you have a fever, pain , or abdominal swelling? No. Pain Score  0 *  Have you tolerated food without any problems? Yes.    Have you been able to return to your normal activities? Yes.    Do you have any questions about your discharge instructions: Diet   No. Medications  No. Follow up visit  No.  Do you have questions or concerns about your Care? No.  Actions: * If pain score is 4 or above: No action needed, pain <4. 1. Have you developed a fever since your procedure? no  2.   Have you had an respiratory symptoms (SOB or cough) since your procedure? nop  3.   Have you tested positive for COVID 19 since your procedure no  4.   Have you had any family members/close contacts diagnosed with the COVID 19 since your procedure?  no   If yes to any of these questions please route to Laverna Peace, RN and Charlett Lango, RN

## 2019-10-08 ENCOUNTER — Telehealth: Payer: Self-pay | Admitting: Internal Medicine

## 2019-10-08 NOTE — Telephone Encounter (Signed)
Scheduled appt per 7/26 los - pt is aware of appts.

## 2019-10-10 ENCOUNTER — Other Ambulatory Visit: Payer: Self-pay

## 2019-10-10 ENCOUNTER — Inpatient Hospital Stay: Payer: Medicare Other

## 2019-10-10 MED ORDER — HYOSCYAMINE SULFATE 0.125 MG SL SUBL
0.1250 mg | SUBLINGUAL_TABLET | SUBLINGUAL | 0 refills | Status: DC | PRN
Start: 2019-10-10 — End: 2020-05-25

## 2019-10-10 NOTE — Patient Instructions (Signed)

## 2019-10-10 NOTE — Progress Notes (Signed)
Therapeutic phlebotomy started at 1422 via 18 G PIV right FA.  Stopped at 1440 with 450 mL's removed.  Stopped due to pt complaint of chest heaviness and light headed.  NS started at 500 mL an hour and 2 L [employer] oxygen applied. VSS.  Cassie, PA notified to assess pt.  Orders received to continue NS at 500 mL an hour. Pt offered a coke and peanut butter crackers.    1451- pt states chest heaviness is improving and light headedness has resolved, VS remain stable.   1510- pt states chest pressure has completely resolved.  Per Cassie PA pt to be monitored for 30 minutes and then discharged home if asymptomatic.   1540-Pt remains asymptomatic, VSS and educated to increase oral fluid intake at home.  Pt verbalized understanding.

## 2019-11-10 ENCOUNTER — Inpatient Hospital Stay: Payer: Medicare Other | Attending: Physician Assistant | Admitting: Internal Medicine

## 2019-11-10 ENCOUNTER — Other Ambulatory Visit: Payer: Self-pay

## 2019-11-10 ENCOUNTER — Inpatient Hospital Stay: Payer: Medicare Other

## 2019-11-10 ENCOUNTER — Telehealth: Payer: Self-pay | Admitting: Medical Oncology

## 2019-11-10 ENCOUNTER — Encounter: Payer: Self-pay | Admitting: Internal Medicine

## 2019-11-10 DIAGNOSIS — R110 Nausea: Secondary | ICD-10-CM | POA: Insufficient documentation

## 2019-11-10 DIAGNOSIS — R109 Unspecified abdominal pain: Secondary | ICD-10-CM | POA: Diagnosis not present

## 2019-11-10 DIAGNOSIS — I272 Pulmonary hypertension, unspecified: Secondary | ICD-10-CM | POA: Diagnosis not present

## 2019-11-10 DIAGNOSIS — D751 Secondary polycythemia: Secondary | ICD-10-CM | POA: Insufficient documentation

## 2019-11-10 DIAGNOSIS — Z79899 Other long term (current) drug therapy: Secondary | ICD-10-CM | POA: Insufficient documentation

## 2019-11-10 DIAGNOSIS — Z86010 Personal history of colonic polyps: Secondary | ICD-10-CM | POA: Diagnosis not present

## 2019-11-10 DIAGNOSIS — Z9981 Dependence on supplemental oxygen: Secondary | ICD-10-CM | POA: Insufficient documentation

## 2019-11-10 DIAGNOSIS — Z90710 Acquired absence of both cervix and uterus: Secondary | ICD-10-CM | POA: Insufficient documentation

## 2019-11-10 DIAGNOSIS — K219 Gastro-esophageal reflux disease without esophagitis: Secondary | ICD-10-CM | POA: Diagnosis not present

## 2019-11-10 LAB — CBC WITH DIFFERENTIAL (CANCER CENTER ONLY)
Abs Immature Granulocytes: 0.03 10*3/uL (ref 0.00–0.07)
Basophils Absolute: 0.1 10*3/uL (ref 0.0–0.1)
Basophils Relative: 1 %
Eosinophils Absolute: 0.2 10*3/uL (ref 0.0–0.5)
Eosinophils Relative: 2 %
HCT: 39.7 % (ref 36.0–46.0)
Hemoglobin: 12.5 g/dL (ref 12.0–15.0)
Immature Granulocytes: 0 %
Lymphocytes Relative: 28 %
Lymphs Abs: 1.9 10*3/uL (ref 0.7–4.0)
MCH: 28.4 pg (ref 26.0–34.0)
MCHC: 31.5 g/dL (ref 30.0–36.0)
MCV: 90.2 fL (ref 80.0–100.0)
Monocytes Absolute: 0.6 10*3/uL (ref 0.1–1.0)
Monocytes Relative: 9 %
Neutro Abs: 4 10*3/uL (ref 1.7–7.7)
Neutrophils Relative %: 60 %
Platelet Count: 227 10*3/uL (ref 150–400)
RBC: 4.4 MIL/uL (ref 3.87–5.11)
RDW: 12.5 % (ref 11.5–15.5)
WBC Count: 6.8 10*3/uL (ref 4.0–10.5)
nRBC: 0 % (ref 0.0–0.2)

## 2019-11-10 LAB — FERRITIN: Ferritin: 43 ng/mL (ref 11–307)

## 2019-11-10 NOTE — Progress Notes (Signed)
[facility] Telephone:(336) [phone]   Fax:([phone]  OFFICE PROGRESS NOTE  [name], MD [address] 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  She has negative JAK2 mutation panel.  PRIOR THERAPY: None  CURRENT THERAPY: Phlebotomy on as-needed basis.  INTERVAL HISTORY: Laurie Finley 64 y.o. female returns to the clinic today for follow-up visit accompanied by her husband.  The patient is feeling fine today with no concerning complaints except for fatigue as well as acid reflux and she is currently on treatment with Protonix.  She was also complaining of shortness of breath secondary to pulmonary hypertension and she was placed on home oxygen by pulmonary medicine.  The patient denied having any current chest pain, cough or hemoptysis.  She denied having any recent weight loss or night sweats.  She has no nausea, vomiting, diarrhea or constipation.  She has no headache or visual changes.  She had repeat CBC, iron study and ferritin performed earlier today and she is here for evaluation and discussion of her lab results.   MEDICAL HISTORY: Past Medical History:  Diagnosis Date  . Allergic rhinitis from [facility] encounter 06/30/2019  . Allergy    SEASONAL  . Anxiety disorder    from [facility] encounter 06/30/2019  . Arthritis of lumbosacral spine   . Asthma   . Asthma    from [facility] encounter 06/30/2019  . Cataract    BILATERAL-REMOVED  . Cervical arthritis   . Chest pain   . Colitis 11/25/1999   from [facility] encounter 06/30/2019  . Colitis due to Clostridium difficile 07/14/2003   from [facility] encounter 06/30/2019  . Colon polyp    from [facility] encounter 06/30/2019  . Diabetes (HCC)   . Diarrhea   . Dizzinesses   . Essential hypertension   . GERD (gastroesophageal reflux disease)    . H. pylori infection   . Hx of labyrinthitis 08/14/2006   from [facility] encounter 06/30/2019  . Hyperlipemia   . Hypokalemia 08/04/2003   from [facility] encounter 06/30/2019  . Hypokalemia 08/04/2003  . Hypothyroid   . Menopausal syndrome    from [facility] encounter 06/30/2019  . Neck pain    from [facility] encounter 06/30/2019  . Osteoporosis   . Palpitations   . Preoperative cardiovascular examination   . SNHL (sensorineural hearing loss)    from [facility] encounter 06/30/2019  . Stroke (HCC)   . Unsteady gait    from [facility] encounter 06/30/2019    ALLERGIES:  is allergic to eggs or egg-derived products, adhesive [tape], asa [aspirin], asmanex (120 metered doses) [mometasone furoate], biaxin [clarithromycin], bonine [meclizine hcl], erythromycin, evista [raloxifene hcl], influenza vaccines, iodinated diagnostic agents, nexium [esomeprazole magnesium], norco [hydrocodone-acetaminophen], oxycodone, prilosec [omeprazole], qvar [beclomethasone], and tessalon [benzonatate].  MEDICATIONS:  Current Outpatient Medications  Medication Sig Dispense Refill  . albuterol (VENTOLIN HFA) 108 (90 Base) MCG/ACT inhaler Inhale 1 puff into the lungs every 6 (six) hours as needed for wheezing or shortness of breath.    Marland Kitchen acetaminophen (TYLENOL) 500 MG tablet Take 500 mg by mouth every 6 (six) hours as needed for mild pain or moderate pain.    . Albuterol Sulfate 108 (90 Base) MCG/ACT AEPB Inhale into the lungs. Nebulizer    . amitriptyline (ELAVIL) 10 MG tablet Take 10 mg by mouth at bedtime.    Marland Kitchen  amLODipine (NORVASC) 5 MG tablet Take 5 mg by mouth daily. Patient takes 1/2 in the morning and 1/2 at supper.    . Butalbital-APAP-Caff-Cod 50-300-40-30 MG CAPS Take by mouth as needed.    . Ca Carbonate-Mag Hydroxide (ROLAIDS PO) Take by mouth. Patient states that she uses 2-3 times weekly.    . Cyanocobalamin (VITAMIN B-12 IJ) Inject as directed every 21 ( twenty-one) days.      Marland Kitchen denosumab (PROLIA) 60 MG/ML SOSY injection Inject 60 mg into the skin every 6 (six) months.    . diazepam (VALIUM) 2 MG tablet Take 2 mg by mouth. Patient takes as needed for dizziness.    . diphenhydrAMINE (BENADRYL) 25 MG tablet Take 25 mg by mouth at bedtime.    . ergocalciferol (VITAMIN D2) 1.25 MG (50000 UT) capsule Take 50,000 Units by mouth once a week. Patient states that she takes 1 by mouth twice a week.    . fluticasone furoate-vilanterol (BREO ELLIPTA) 200-25 MCG/INH AEPB Inhale 1 puff into the lungs daily. Patient uses the 100/25 - 1 puff every morning.    . hyoscyamine (LEVSIN SL) 0.125 MG SL tablet Place 1 tablet (0.125 mg total) under the tongue every 4 (four) hours as needed for cramping (cramping and diarrhea). 30 tablet 0  . LEVOTHYROXINE SODIUM PO Take 25 mcg by mouth.    . meclizine (ANTIVERT) 12.5 MG tablet Take 12.5 mg by mouth 3 (three) times daily as needed for dizziness.    Marland Kitchen METOPROLOL TARTRATE PO Take by mouth. 12.5 mg in the morning and 12.5 mg in at night.    . pantoprazole (PROTONIX) 40 MG tablet Take 1 tablet (40 mg total) by mouth daily. 30 tablet 2  . pravastatin (PRAVACHOL) 10 MG tablet Take 10 mg by mouth daily.    . Wheat Dextrin (BENEFIBER ON THE GO PO) Take 1 packet by mouth. 3/3.5 gram packet daily from [facility] encounter 06/30/2019     Current Facility-Administered Medications  Medication Dose Route Frequency Provider Last Rate Last Admin  . 0.9 %  sodium chloride infusion  500 mL Intravenous Once Nandigam, Eleonore Chiquito, MD        SURGICAL HISTORY:  Past Surgical History:  Procedure Laterality Date  . ABDOMINAL HYSTERECTOMY    . ALLOGRAFT SPINE MORSELIZED Bilateral 1226/2017   from [facility] encounter 06/30/2019, DrDavid Prior  . APPENDECTOMY    . CATARACT EXTRACTION    . CHOLECYSTECTOMY    . COLONOSCOPY    . HYSTEROSCOPY WITH SALPINGOGRAM    . TONSILLECTOMY    . UPPER GASTROINTESTINAL ENDOSCOPY      REVIEW OF SYSTEMS:  A  comprehensive review of systems was negative except for: Constitutional: positive for fatigue Respiratory: positive for dyspnea on exertion Gastrointestinal: positive for dyspepsia   PHYSICAL EXAMINATION: General appearance: alert, cooperative, fatigued and no distress Head: Normocephalic, without obvious abnormality, atraumatic Neck: no adenopathy, no JVD, supple, symmetrical, trachea midline and thyroid not enlarged, symmetric, no tenderness/mass/nodules Lymph nodes: Cervical, supraclavicular, and axillary nodes normal. Resp: clear to auscultation bilaterally Back: symmetric, no curvature. ROM normal. No CVA tenderness. Cardio: regular rate and rhythm, S1, S2 normal, no murmur, click, rub or gallop GI: soft, non-tender; bowel sounds normal; no masses,  no organomegaly Extremities: extremities normal, atraumatic, no cyanosis or edema  ECOG PERFORMANCE STATUS: 1 - Symptomatic but completely ambulatory  Blood pressure 131/67, pulse 71, temperature 97.6 F (36.4 C), temperature source Tympanic, resp. rate 18, height 5\' 2"  (1.575 m), weight 229 lb  14.4 oz (104.3 kg), SpO2 100 %.  LABORATORY DATA: Lab Results  Component Value Date   WBC 6.8 11/10/2019   HGB 12.5 11/10/2019   HCT 39.7 11/10/2019   MCV 90.2 11/10/2019   PLT 227 11/10/2019      Chemistry      Component Value Date/Time   NA 141 10/06/2019 0824   K 3.6 10/06/2019 0824   CL 106 10/06/2019 0824   CO2 26 10/06/2019 0824   BUN 6 (L) 10/06/2019 0824   CREATININE 0.88 10/06/2019 0824   CREATININE 0.80 01/02/2019 1329      Component Value Date/Time   CALCIUM 10.3 10/06/2019 0824   ALKPHOS 68 10/06/2019 0824   AST 18 10/06/2019 0824   ALT 16 10/06/2019 0824   BILITOT 0.4 10/06/2019 0824       RADIOGRAPHIC STUDIES: No results found.  ASSESSMENT AND PLAN: This is a very pleasant 64 years old white female recently diagnosed with hemochromatosis with DNA mutation and C282Y and H63D.  The patient continues to complain  of increasing fatigue and weakness as well as intermittent abdominal pain and nausea. I had a lengthy discussion with the patient and her husband about her current condition and treatment options. Her Jak 2 mutation panel was unremarkable. The patient is currently on phlebotomy to keep her ferritin level less than 50. Repeat CBC, iron study and ferritin today were unremarkable and her ferritin level was down to 43. I recommended for the patient to continue on observation with repeat CBC, iron study and ferritin in 3 months.  She will not need phlebotomy today. She was advised to call immediately if she has any concerning symptoms in the interval. The patient voices understanding of current disease status and treatment options and is in agreement with the current care plan.  All questions were answered. The patient knows to call the clinic with any problems, questions or concerns. We can certainly see the patient much sooner if necessary.  Disclaimer: This note was dictated with voice recognition software. Similar sounding words can inadvertently be transcribed and may not be corrected upon review.

## 2019-11-10 NOTE — Telephone Encounter (Signed)
LVM that ferritin is good and return in 3 months.

## 2019-11-11 ENCOUNTER — Telehealth: Payer: Self-pay | Admitting: Internal Medicine

## 2019-11-11 NOTE — Telephone Encounter (Signed)
Scheduled per los. Called and spoke with patient. Confirmed appt 

## 2019-12-15 ENCOUNTER — Ambulatory Visit (INDEPENDENT_AMBULATORY_CARE_PROVIDER_SITE_OTHER): Payer: Medicare Other | Admitting: Gastroenterology

## 2019-12-15 ENCOUNTER — Encounter: Payer: Self-pay | Admitting: Gastroenterology

## 2019-12-15 VITALS — BP 148/78 | HR 67 | Ht 62.0 in | Wt 230.2 lb

## 2019-12-15 DIAGNOSIS — K580 Irritable bowel syndrome with diarrhea: Secondary | ICD-10-CM

## 2019-12-15 DIAGNOSIS — R109 Unspecified abdominal pain: Secondary | ICD-10-CM | POA: Diagnosis not present

## 2019-12-15 DIAGNOSIS — K219 Gastro-esophageal reflux disease without esophagitis: Secondary | ICD-10-CM | POA: Diagnosis not present

## 2019-12-15 DIAGNOSIS — R1030 Lower abdominal pain, unspecified: Secondary | ICD-10-CM

## 2019-12-15 DIAGNOSIS — Z8742 Personal history of other diseases of the female genital tract: Secondary | ICD-10-CM

## 2019-12-15 DIAGNOSIS — K641 Second degree hemorrhoids: Secondary | ICD-10-CM

## 2019-12-15 DIAGNOSIS — N809 Endometriosis, unspecified: Secondary | ICD-10-CM

## 2019-12-15 DIAGNOSIS — K5730 Diverticulosis of large intestine without perforation or abscess without bleeding: Secondary | ICD-10-CM

## 2019-12-15 MED ORDER — LINACLOTIDE 72 MCG PO CAPS: 72.0000 ug | ORAL_CAPSULE | Freq: Every day | ORAL | 3 refills | Status: DC

## 2019-12-15 MED ORDER — RIFAXIMIN 550 MG PO TABS: 550.0000 mg | ORAL_TABLET | Freq: Three times a day (TID) | ORAL | 0 refills | Status: DC

## 2019-12-15 MED ORDER — HYDROCORTISONE ACETATE 25 MG RE SUPP: 25.0000 mg | Freq: Every day | RECTAL | 0 refills | Status: DC

## 2019-12-15 MED ORDER — PANTOPRAZOLE SODIUM 40 MG PO TBEC
40.0000 mg | DELAYED_RELEASE_TABLET | Freq: Every day | ORAL | 3 refills | Status: DC
Start: 2019-12-15 — End: 2020-09-27

## 2019-12-15 NOTE — Patient Instructions (Addendum)
We have sent your demographic information and a prescription for Xifaxan to [facility]. This pharmacy is able to get medication approved through insurance and get you the lowest copay possible. If you have not heard from them within 1 week, please call our office at [phone] to let us know.  We have sent Linzess, Protonix and Anusol Suppositories to your pharmacy   Gastroesophageal Reflux Disease, Adult Gastroesophageal reflux (GER) happens when acid from the stomach flows up into the tube that connects the mouth and the stomach (esophagus). Normally, food travels down the esophagus and stays in the stomach to be digested. However, when a person has GER, food and stomach acid sometimes move back up into the esophagus. If this becomes a more serious problem, the person may be diagnosed with a disease called gastroesophageal reflux disease (GERD). GERD occurs when the reflux:  Happens often.  Causes frequent or severe symptoms.  Causes problems such as damage to the esophagus. When stomach acid comes in contact with the esophagus, the acid may cause soreness (inflammation) in the esophagus. Over time, GERD may create small holes (ulcers) in the lining of the esophagus. What are the causes? This condition is caused by a problem with the muscle between the esophagus and the stomach (lower esophageal sphincter, or LES). Normally, the LES muscle closes after food passes through the esophagus to the stomach. When the LES is weakened or abnormal, it does not close properly, and that allows food and stomach acid to go back up into the esophagus. The LES can be weakened by certain dietary substances, medicines, and medical conditions, including:  Tobacco use.  Pregnancy.  Having a hiatal hernia.  Alcohol use.  Certain foods and beverages, such as coffee, chocolate, onions, and peppermint. What increases the risk? You are more likely to develop this condition if you:  Have an  increased body weight.  Have a connective tissue disorder.  Use NSAID medicines. What are the signs or symptoms? Symptoms of this condition include:  Heartburn.  Difficult or painful swallowing.  The feeling of having a lump in the throat.  Abitter taste in the mouth.  Bad breath.  Having a large amount of saliva.  Having an upset or bloated stomach.  Belching.  Chest pain. Different conditions can cause chest pain. Make sure you see your health care provider if you experience chest pain.  Shortness of breath or wheezing.  Ongoing (chronic) cough or a night-time cough.  Wearing away of tooth enamel.  Weight loss. How is this diagnosed? Your health care provider will take a medical history and perform a physical exam. To determine if you have mild or severe GERD, your health care provider may also monitor how you respond to treatment. You may also have tests, including:  A test to examine your stomach and esophagus with a small camera (endoscopy).  A test thatmeasures the acidity level in your esophagus.  A test thatmeasures how much pressure is on your esophagus.  A barium swallow or modified barium swallow test to show the shape, size, and functioning of your esophagus. How is this treated? The goal of treatment is to help relieve your symptoms and to prevent complications. Treatment for this condition may vary depending on how severe your symptoms are. Your health care provider may recommend:  Changes to your diet.  Medicine.  Surgery. Follow these instructions at home: Eating and drinking   Follow a diet as recommended by your health care provider. This may involve  avoiding foods and drinks such as: ? Coffee and tea (with or without caffeine). ? Drinks that containalcohol. ? Energy drinks and sports drinks. ? Carbonated drinks or sodas. ? Chocolate and cocoa. ? Peppermint and mint flavorings. ? Garlic and onions. ? Horseradish. ? Spicy and acidic  foods, including peppers, chili powder, curry powder, vinegar, hot sauces, and barbecue sauce. ? Citrus fruit juices and citrus fruits, such as oranges, lemons, and limes. ? Tomato-based foods, such as red sauce, chili, salsa, and pizza with red sauce. ? Fried and fatty foods, such as donuts, french fries, potato chips, and high-fat dressings. ? High-fat meats, such as hot dogs and fatty cuts of red and white meats, such as rib eye steak, sausage, ham, and bacon. ? High-fat dairy items, such as whole milk, butter, and cream cheese.  Eat small, frequent meals instead of large meals.  Avoid drinking large amounts of liquid with your meals.  Avoid eating meals during the 2-3 hours before bedtime.  Avoid lying down right after you eat.  Do not exercise right after you eat. Lifestyle   Do not use any products that contain nicotine or tobacco, such as cigarettes, e-cigarettes, and chewing tobacco. If you need help quitting, ask your health care provider.  Try to reduce your stress by using methods such as yoga or meditation. If you need help reducing stress, ask your health care provider.  If you are overweight, reduce your weight to an amount that is healthy for you. Ask your health care provider for guidance about a safe weight loss goal. General instructions  Pay attention to any changes in your symptoms.  Take over-the-counter and prescription medicines only as told by your health care provider. Do not take aspirin, ibuprofen, or other NSAIDs unless your health care provider told you to do so.  Wear loose-fitting clothing. Do not wear anything tight around your waist that causes pressure on your abdomen.  Raise (elevate) the head of your bed about 6 inches (15 cm).  Avoid bending over if this makes your symptoms worse.  Keep all follow-up visits as told by your health care provider. This is important. Contact a health care provider if:  You have: ? New symptoms. ? Unexplained  weight loss. ? Difficulty swallowing or it hurts to swallow. ? Wheezing or a persistent cough. ? A hoarse voice.  Your symptoms do not improve with treatment. Get help right away if you:  Have pain in your arms, neck, jaw, teeth, or back.  Feel sweaty, dizzy, or light-headed.  Have chest pain or shortness of breath.  Vomit and your vomit looks like blood or coffee grounds.  Faint.  Have stool that is bloody or black.  Cannot swallow, drink, or eat. Summary  Gastroesophageal reflux happens when acid from the stomach flows up into the esophagus. GERD is a disease in which the reflux happens often, causes frequent or severe symptoms, or causes problems such as damage to the esophagus.  Treatment for this condition may vary depending on how severe your symptoms are. Your health care provider may recommend diet and lifestyle changes, medicine, or surgery.  Contact a health care provider if you have new or worsening symptoms.  Take over-the-counter and prescription medicines only as told by your health care provider. Do not take aspirin, ibuprofen, or other NSAIDs unless your health care provider told you to do so.  Keep all follow-up visits as told by your health care provider. This is important. This information is not intended  to replace advice given to you by your health care provider. Make sure you discuss any questions you have with your health care provider. Document Revised: 09/05/2017 Document Reviewed: 09/05/2017 Elsevier Patient Education  2020 [employer].   I appreciate the  opportunity to care for you  Thank You   Marsa Aris , MD

## 2019-12-15 NOTE — Progress Notes (Signed)
Laurie Finley    [id number]    06-Oct-1955  Primary Care Physician:Eason, Renae Fickle, MD  Referring Physician: [name], MD [id number] [place],  [place] [address]   Chief complaint: Abdominal pain, diarrhea, nausea  HPI: 64 year old very pleasant female here for follow-up visit with complaints of lower abdominal pain associated with irregular bowel habits predominantly diarrhea, multiple bowel movements throughout the day.  No nocturnal diarrhea History of endometriosis s/p ex lap and has extensive scar tissue  Denies any rectal bleeding or melena.  No vomiting.  She has intermittent nausea associated with multiple bowel movements and abdominal pain  Colonoscopy October 03, 2019: For diarrhea evaluation, random colon biopsies negative for microscopic colitis.  Diverticulosis and internal hemorrhoid EGD October 03, 2019: Mild erosive esophagitis secondary to reflux, hiatal hernia, CLOtest negative for H. pylori otherwise normal exam   Outpatient Encounter Medications as of 12/15/2019  Medication Sig  . acetaminophen (TYLENOL) 500 MG tablet Take 500 mg by mouth every 6 (six) hours as needed for mild pain or moderate pain.  Marland Kitchen albuterol (VENTOLIN HFA) 108 (90 Base) MCG/ACT inhaler Inhale 1 puff into the lungs every 6 (six) hours as needed for wheezing or shortness of breath.  . Albuterol Sulfate 108 (90 Base) MCG/ACT AEPB Inhale into the lungs. Nebulizer  . amitriptyline (ELAVIL) 10 MG tablet Take 10 mg by mouth at bedtime.  Marland Kitchen amLODipine (NORVASC) 5 MG tablet Take 5 mg by mouth daily. Patient takes 1/2 in the morning and 1/2 at supper.  . Butalbital-APAP-Caff-Cod 50-300-40-30 MG CAPS Take by mouth as needed.  . Ca Carbonate-Mag Hydroxide (ROLAIDS PO) Take by mouth. Patient states that she uses 2-3 times weekly.  . Cyanocobalamin (VITAMIN B-12 IJ) Inject as directed every 21 ( twenty-one) days.   Marland Kitchen denosumab (PROLIA) 60 MG/ML SOSY injection Inject 60 mg into the skin every 6  (six) months.  . diazepam (VALIUM) 2 MG tablet Take 2 mg by mouth. Patient takes as needed for dizziness.  . diphenhydrAMINE (BENADRYL) 25 MG tablet Take 25 mg by mouth at bedtime.  . ergocalciferol (VITAMIN D2) 1.25 MG (50000 UT) capsule Take 50,000 Units by mouth once a week. Patient states that she takes 1 by mouth twice a week.  . fluticasone furoate-vilanterol (BREO ELLIPTA) 200-25 MCG/INH AEPB Inhale 1 puff into the lungs daily. Patient uses the 100/25 - 1 puff every morning.  . hyoscyamine (LEVSIN SL) 0.125 MG SL tablet Place 1 tablet (0.125 mg total) under the tongue every 4 (four) hours as needed for cramping (cramping and diarrhea).  . LEVOTHYROXINE SODIUM PO Take 25 mcg by mouth.  . meclizine (ANTIVERT) 12.5 MG tablet Take 12.5 mg by mouth 3 (three) times daily as needed for dizziness.  Marland Kitchen METOPROLOL TARTRATE PO Take by mouth. 12.5 mg in the morning and 12.5 mg in at night.  . pantoprazole (PROTONIX) 40 MG tablet Take 1 tablet (40 mg total) by mouth daily.  . pravastatin (PRAVACHOL) 10 MG tablet Take 10 mg by mouth daily.  . Wheat Dextrin (BENEFIBER ON THE GO PO) Take 1 packet by mouth. 3/3.5 gram packet daily from [facility] encounter 06/30/2019   Facility-Administered Encounter Medications as of 12/15/2019  Medication  . 0.9 %  sodium chloride infusion    Allergies as of 12/15/2019 - Review Complete 11/10/2019  Allergen Reaction Noted  . Eggs or egg-derived products Anaphylaxis and Other (See Comments) 02/08/2009  .  Adhesive [tape]  01/02/2019  . Asa [aspirin]  01/02/2019  . Asmanex (120 metered doses) [mometasone furoate]  01/02/2019  . Biaxin [clarithromycin]  01/02/2019  . Bonine [meclizine hcl]  01/02/2019  . Erythromycin  01/02/2019  . Evista [raloxifene hcl]  01/02/2019  . Influenza vaccines  01/02/2019  . Iodinated diagnostic agents  01/02/2019  . Nexium [esomeprazole magnesium]  01/02/2019  . Norco [hydrocodone-acetaminophen]  01/02/2019  . Oxycodone  01/02/2019   . Prilosec [omeprazole]  01/02/2019  . Qvar [beclomethasone]  01/02/2019  . Tessalon [benzonatate]  01/02/2019    Past Medical History:  Diagnosis Date  . Allergic rhinitis from [facility] encounter 06/30/2019  . Allergy    SEASONAL  . Anxiety disorder    from [facility] encounter 06/30/2019  . Arthritis of lumbosacral spine   . Asthma   . Asthma    from [facility] encounter 06/30/2019  . Cataract    BILATERAL-REMOVED  . Cervical arthritis   . Chest pain   . Colitis 11/25/1999   from [facility] encounter 06/30/2019  . Colitis due to Clostridium difficile 07/14/2003   from [facility] encounter 06/30/2019  . Colon polyp    from [facility] encounter 06/30/2019  . Diabetes (HCC)   . Diarrhea   . Dizzinesses   . Essential hypertension   . GERD (gastroesophageal reflux disease)   . H. pylori infection   . Hx of labyrinthitis 08/14/2006   from [facility] encounter 06/30/2019  . Hyperlipemia   . Hypokalemia 08/04/2003   from [facility] encounter 06/30/2019  . Hypokalemia 08/04/2003  . Hypothyroid   . Menopausal syndrome    from [facility] encounter 06/30/2019  . Neck pain    from [facility] encounter 06/30/2019  . Osteoporosis   . Palpitations   . Preoperative cardiovascular examination   . SNHL (sensorineural hearing loss)    from [facility] encounter 06/30/2019  . Stroke (HCC)   . Unsteady gait    from [facility] encounter 06/30/2019    Past Surgical History:  Procedure Laterality Date  . ABDOMINAL HYSTERECTOMY    . ALLOGRAFT SPINE MORSELIZED Bilateral 1226/2017   from [facility] encounter 06/30/2019, DrDavid Prior  . APPENDECTOMY    . CATARACT EXTRACTION    . CHOLECYSTECTOMY    . COLONOSCOPY    . HYSTEROSCOPY WITH SALPINGOGRAM    . TONSILLECTOMY    . UPPER GASTROINTESTINAL ENDOSCOPY      Family History  Problem Relation Age of Onset  . Osteoporosis Mother   . Stroke Mother   . Pancreatic cancer Mother   .  Hypertension Father   . Epilepsy Sister   . Hypertension Sister   . Epilepsy Brother   . Colon cancer Brother   . Hypertension Brother   . Liver cancer Sister   . Diabetes Sister   . Hypertension Sister   . Healthy Sister   . Hypertension Sister   . Hypertension Sister   . Hypertension Sister   . Hypertension Sister   . Cirrhosis Sister   . Hepatitis C Sister   . Leukemia Brother   . Stomach cancer Paternal Aunt   . Esophageal cancer Neg Hx   . Rectal cancer Neg Hx     Social History   Socioeconomic History  . Marital status: Married    Spouse name: Not on file  . Number of children: Not on file  . Years of education: Not on file  . Highest education level: Not on file  Occupational History  . Not on file  Tobacco Use  . Smoking status: Never Smoker  . Smokeless tobacco: Never Used  Vaping Use  . Vaping Use: Never used  Substance and Sexual Activity  . Alcohol use: Never  . Drug use: Never  . Sexual activity: Not on file  Other Topics Concern  . Not on file  Social History Narrative  . Not on file   Social Determinants of Health   Financial Resource Strain:   . Difficulty of Paying Living Expenses: Not on file  Food Insecurity:   . Worried About Programme researcher, broadcasting/film/video in the Last Year: Not on file  . Ran Out of Food in the Last Year: Not on file  Transportation Needs:   . Lack of Transportation (Medical): Not on file  . Lack of Transportation (Non-Medical): Not on file  Physical Activity:   . Days of Exercise per Week: Not on file  . Minutes of Exercise per Session: Not on file  Stress:   . Feeling of Stress : Not on file  Social Connections:   . Frequency of Communication with Friends and Family: Not on file  . Frequency of Social Gatherings with Friends and Family: Not on file  . Attends Religious Services: Not on file  . Active Member of Clubs or Organizations: Not on file  . Attends Banker Meetings: Not on file  . Marital Status: Not  on file  Intimate Partner Violence:   . Fear of Current or Ex-Partner: Not on file  . Emotionally Abused: Not on file  . Physically Abused: Not on file  . Sexually Abused: Not on file      Review of systems: All other review of systems negative except as mentioned in the HPI.   Physical Exam: Vitals:   12/15/19 1336  BP: (!) 148/78  Pulse: 67  SpO2: 97%   Body mass index is 42.1 kg/m. Gen:      No acute distress HEENT:  sclera anicteric Abd:      soft, non-tender; no palpable masses, no distension Ext:    No edema Neuro: alert and oriented x 3 Psych: normal mood and affect  Data Reviewed:  Reviewed labs, radiology imaging, old records and pertinent past GI work up   Assessment and Plan/Recommendations:  64 year old very pleasant female with history of endometriosis, ex lap, TAH/BSO, appendectomy and cholecystectomy with extensive intra-abdominal and pelvic adhesions with complaints of lower abdominal pain, cramping, irritable bowel syndrome predominant diarrhea  IBS diarrhea:  We will do a course of Xifaxan 550 mg 3 times daily for 14 days Possible additional component of overflow diarrhea /incomplete evacuation secondary to extensive pelvic adhesions and diverticulosis  We will do a trial of low-dose Linzess 72 mcg daily  Start Benefiber 1 teaspoon 3 times daily with meals  Increase water intake to 60 to 80 ounces per day   Colonoscopy with biopsies negative for microscopic colitis Due for recall colonoscopy 2031  Symptomatic hemorrhoids: Anusol suppository per rectum for 5 to 7 days  Intermittent nausea could be secondary to uncontrolled reflux, has mild reflux esophagitis  Protonix 40 mg daily  Antireflux measures  This visit required >45 minutes of patient care (this includes precharting, chart review, review of results, face-to-face time used for counseling as well as treatment plan and follow-up. The patient was provided an opportunity to ask questions and  all were answered. The patient agreed with the plan and demonstrated an understanding of the instructions.  Iona BeardK. Veena Teja Judice , MD    CC: Kathlee NationsEason, Paul, MD

## 2019-12-29 ENCOUNTER — Telehealth: Payer: Self-pay | Admitting: *Deleted

## 2019-12-29 ENCOUNTER — Telehealth: Payer: Self-pay | Admitting: Gastroenterology

## 2019-12-29 NOTE — Telephone Encounter (Signed)
Patient approved for xifaxan with [employer]  Sent approval to be scanned in   Approval authorizes coverage from 09/26/2019-01/08/2020

## 2019-12-29 NOTE — Telephone Encounter (Signed)
Zella Ball Would you please take care of her for the Linzess?

## 2019-12-29 NOTE — Telephone Encounter (Signed)
Called patient to pick up samples of Linzess 72 mcg, faxed over Lic number of Dr Lavon Paganini to [place] patient assistance  Patient provided Missing medicare ID patient filled out this paperwork on her own and she was missing information

## 2019-12-31 ENCOUNTER — Telehealth: Payer: Self-pay | Admitting: Gastroenterology

## 2020-01-01 NOTE — Telephone Encounter (Signed)
Patient is advised.  

## 2020-01-01 NOTE — Telephone Encounter (Signed)
What do you recommend for this patient?

## 2020-01-01 NOTE — Telephone Encounter (Signed)
Please advise her to avoid probiotics, complete course of Rifaximin and follow up in office visit. Thanks

## 2020-02-11 ENCOUNTER — Encounter: Payer: Self-pay | Admitting: Internal Medicine

## 2020-02-11 ENCOUNTER — Other Ambulatory Visit: Payer: Self-pay

## 2020-02-11 ENCOUNTER — Inpatient Hospital Stay: Payer: Medicare Other | Attending: Internal Medicine | Admitting: Internal Medicine

## 2020-02-11 ENCOUNTER — Inpatient Hospital Stay: Payer: Medicare Other

## 2020-02-11 DIAGNOSIS — Z90710 Acquired absence of both cervix and uterus: Secondary | ICD-10-CM | POA: Insufficient documentation

## 2020-02-11 DIAGNOSIS — R109 Unspecified abdominal pain: Secondary | ICD-10-CM | POA: Diagnosis not present

## 2020-02-11 DIAGNOSIS — R5383 Other fatigue: Secondary | ICD-10-CM | POA: Diagnosis not present

## 2020-02-11 DIAGNOSIS — R110 Nausea: Secondary | ICD-10-CM | POA: Diagnosis not present

## 2020-02-11 DIAGNOSIS — D751 Secondary polycythemia: Secondary | ICD-10-CM | POA: Diagnosis not present

## 2020-02-11 DIAGNOSIS — J45909 Unspecified asthma, uncomplicated: Secondary | ICD-10-CM | POA: Diagnosis not present

## 2020-02-11 DIAGNOSIS — I272 Pulmonary hypertension, unspecified: Secondary | ICD-10-CM | POA: Insufficient documentation

## 2020-02-11 DIAGNOSIS — Z86010 Personal history of colonic polyps: Secondary | ICD-10-CM | POA: Insufficient documentation

## 2020-02-11 DIAGNOSIS — Z9981 Dependence on supplemental oxygen: Secondary | ICD-10-CM | POA: Insufficient documentation

## 2020-02-11 DIAGNOSIS — R531 Weakness: Secondary | ICD-10-CM | POA: Insufficient documentation

## 2020-02-11 LAB — CBC WITH DIFFERENTIAL (CANCER CENTER ONLY)
Abs Immature Granulocytes: 0.03 10*3/uL (ref 0.00–0.07)
Basophils Absolute: 0.1 10*3/uL (ref 0.0–0.1)
Basophils Relative: 1 %
Eosinophils Absolute: 0.2 10*3/uL (ref 0.0–0.5)
Eosinophils Relative: 3 %
HCT: 41.3 % (ref 36.0–46.0)
Hemoglobin: 13.5 g/dL (ref 12.0–15.0)
Immature Granulocytes: 1 %
Lymphocytes Relative: 36 %
Lymphs Abs: 2.2 10*3/uL (ref 0.7–4.0)
MCH: 27.6 pg (ref 26.0–34.0)
MCHC: 32.7 g/dL (ref 30.0–36.0)
MCV: 84.5 fL (ref 80.0–100.0)
Monocytes Absolute: 0.6 10*3/uL (ref 0.1–1.0)
Monocytes Relative: 9 %
Neutro Abs: 3.1 10*3/uL (ref 1.7–7.7)
Neutrophils Relative %: 50 %
Platelet Count: 224 10*3/uL (ref 150–400)
RBC: 4.89 MIL/uL (ref 3.87–5.11)
RDW: 15 % (ref 11.5–15.5)
WBC Count: 6.2 10*3/uL (ref 4.0–10.5)
nRBC: 0 % (ref 0.0–0.2)

## 2020-02-11 LAB — FERRITIN: Ferritin: 78 ng/mL (ref 11–307)

## 2020-02-11 LAB — IRON AND TIBC
Iron: 110 ug/dL (ref 41–142)
Saturation Ratios: 32 % (ref 21–57)
TIBC: 340 ug/dL (ref 236–444)
UIBC: 230 ug/dL (ref 120–384)

## 2020-02-11 NOTE — Progress Notes (Signed)
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  She has negative JAK2 mutation panel.  PRIOR THERAPY: None  CURRENT THERAPY: Phlebotomy on as-needed basis.  INTERVAL HISTORY: Laurie Finley 64 y.o. female returns to the clinic today for follow-up visit accompanied by her husband.  The patient is feeling fine today with no concerning complaints except for the shortness of breath at baseline and she is currently on home oxygen secondary to pulmonary hypertension and bronchiectasis.  She denied having any chest pain but has cough with no hemoptysis.  She denied having any recent weight loss or night sweats.  She has no nausea, vomiting, diarrhea or constipation.  She is here today for evaluation and repeat blood work and consideration of phlebotomy if needed.   MEDICAL HISTORY: Past Medical History:  Diagnosis Date  . Allergic rhinitis from [facility] encounter 06/30/2019  . Allergy    SEASONAL  . Anxiety disorder    from [facility] encounter 06/30/2019  . Arthritis of lumbosacral spine   . Asthma   . Asthma    from [facility] encounter 06/30/2019  . Cataract    BILATERAL-REMOVED  . Cervical arthritis   . Chest pain   . Colitis 11/25/1999   from [facility] encounter 06/30/2019  . Colitis due to Clostridium difficile 07/14/2003   from [facility] encounter 06/30/2019  . Colon polyp    from [facility] encounter 06/30/2019  . Diabetes (HCC)   . Diarrhea   . Dizzinesses   . Essential hypertension   . GERD (gastroesophageal reflux disease)   . H. pylori infection   . Hx of labyrinthitis 08/14/2006   from [facility] encounter 06/30/2019  . Hyperlipemia   . Hypokalemia 08/04/2003   from [facility]  encounter 06/30/2019  . Hypokalemia 08/04/2003  . Hypothyroid   . Menopausal syndrome    from [facility] encounter 06/30/2019  . Neck pain    from [facility] encounter 06/30/2019  . Osteoporosis   . Palpitations   . Preoperative cardiovascular examination   . SNHL (sensorineural hearing loss)    from [facility] encounter 06/30/2019  . Stroke (HCC)   . Unsteady gait    from [facility] encounter 06/30/2019    ALLERGIES:  is allergic to eggs or egg-derived products, adhesive [tape], asa [aspirin], asmanex (120 metered doses) [mometasone furoate], biaxin [clarithromycin], erythromycin, evista [raloxifene hcl], influenza vaccines, iodinated diagnostic agents, nexium [esomeprazole magnesium], norco [hydrocodone-acetaminophen], oxycodone, prilosec [omeprazole], qvar [beclomethasone], and tessalon [benzonatate].  MEDICATIONS:  Current Outpatient Medications  Medication Sig Dispense Refill  . acetaminophen (TYLENOL) 500 MG tablet Take 500 mg by mouth every 6 (six) hours as needed for mild pain or moderate pain.    Marland Kitchen albuterol (VENTOLIN HFA) 108 (90 Base) MCG/ACT inhaler Inhale 1 puff into the lungs every 6 (six) hours as needed for wheezing or shortness of breath.    . Albuterol Sulfate 108 (90 Base) MCG/ACT AEPB Inhale into the lungs. Nebulizer    . amitriptyline (ELAVIL) 10 MG tablet Take 10 mg by mouth at bedtime.    Marland Kitchen amLODipine (NORVASC) 5 MG tablet Take 5 mg by mouth daily. Patient takes 1/2 in the morning and 1/2 at supper.    . Butalbital-APAP-Caff-Cod 50-300-40-30 MG CAPS Take by mouth  as needed.    . Ca Carbonate-Mag Hydroxide (ROLAIDS PO) Take by mouth. Patient states that she uses 2-3 times weekly.    . Cyanocobalamin (VITAMIN B-12 IJ) Inject as directed every 21 ( twenty-one) days.     Marland Kitchen denosumab (PROLIA) 60 MG/ML SOSY injection Inject 60 mg into the skin every 6 (six) months.    . diazepam (VALIUM) 2 MG tablet Take 2 mg by mouth. Patient takes as needed for dizziness.     . diphenhydrAMINE (BENADRYL) 25 MG tablet Take 25 mg by mouth at bedtime.    . ergocalciferol (VITAMIN D2) 1.25 MG (50000 UT) capsule Take 50,000 Units by mouth once a week. Patient states that she takes 1 by mouth twice a week.    . fluticasone furoate-vilanterol (BREO ELLIPTA) 200-25 MCG/INH AEPB Inhale 1 puff into the lungs daily. Patient uses the 100/25 - 1 puff every morning.    . hydrocortisone (ANUSOL-HC) 25 MG suppository Place 1 suppository (25 mg total) rectally at bedtime. X 7 days 7 suppository 0  . hyoscyamine (LEVSIN SL) 0.125 MG SL tablet Place 1 tablet (0.125 mg total) under the tongue every 4 (four) hours as needed for cramping (cramping and diarrhea). 30 tablet 0  . levothyroxine (SYNTHROID) 25 MCG tablet Take 25 mcg by mouth daily.    Marland Kitchen linaclotide (LINZESS) 72 MCG capsule Take 1 capsule (72 mcg total) by mouth daily before breakfast. 30 capsule 3  . meclizine (ANTIVERT) 12.5 MG tablet Take 12.5 mg by mouth 3 (three) times daily as needed for dizziness.    Marland Kitchen METOPROLOL TARTRATE PO Take by mouth. 12.5 mg in the morning and 12.5 mg in at night.    . OXYGEN Inhale 2 L into the lungs continuous.     . pantoprazole (PROTONIX) 40 MG tablet Take 1 tablet (40 mg total) by mouth daily. 90 tablet 3  . pravastatin (PRAVACHOL) 10 MG tablet Take 10 mg by mouth daily.    . rifaximin (XIFAXAN) 550 MG TABS tablet Take 1 tablet (550 mg total) by mouth 3 (three) times daily. X 14 days 42 tablet 0  . Wheat Dextrin (BENEFIBER ON THE GO PO) Take 1 packet by mouth. 3/3.5 gram packet daily from [facility] encounter 06/30/2019     No current facility-administered medications for this visit.    SURGICAL HISTORY:  Past Surgical History:  Procedure Laterality Date  . ABDOMINAL HYSTERECTOMY    . ALLOGRAFT SPINE MORSELIZED Bilateral 1226/2017   from [facility] encounter 06/30/2019, DrDavid Prior  . APPENDECTOMY    . CATARACT EXTRACTION    . CHOLECYSTECTOMY    . COLONOSCOPY    .  HYSTEROSCOPY WITH SALPINGOGRAM    . TONSILLECTOMY    . UPPER GASTROINTESTINAL ENDOSCOPY      REVIEW OF SYSTEMS:  A comprehensive review of systems was negative except for: Constitutional: positive for fatigue Respiratory: positive for dyspnea on exertion   PHYSICAL EXAMINATION: General appearance: alert, cooperative, fatigued and no distress Head: Normocephalic, without obvious abnormality, atraumatic Neck: no adenopathy, no JVD, supple, symmetrical, trachea midline and thyroid not enlarged, symmetric, no tenderness/mass/nodules Lymph nodes: Cervical, supraclavicular, and axillary nodes normal. Resp: clear to auscultation bilaterally Back: symmetric, no curvature. ROM normal. No CVA tenderness. Cardio: regular rate and rhythm, S1, S2 normal, no murmur, click, rub or gallop GI: soft, non-tender; bowel sounds normal; no masses,  no organomegaly Extremities: extremities normal, atraumatic, no cyanosis or edema  ECOG PERFORMANCE STATUS: 1 - Symptomatic but completely ambulatory  Blood pressure  119/77, pulse 71, temperature 97.7 F (36.5 C), temperature source Tympanic, resp. rate 18, height 5\' 2"  (1.575 m), weight 232 lb 3.2 oz (105.3 kg), SpO2 97 %.  LABORATORY DATA: Lab Results  Component Value Date   WBC 6.2 02/11/2020   HGB 13.5 02/11/2020   HCT 41.3 02/11/2020   MCV 84.5 02/11/2020   PLT 224 02/11/2020      Chemistry      Component Value Date/Time   NA 141 10/06/2019 0824   K 3.6 10/06/2019 0824   CL 106 10/06/2019 0824   CO2 26 10/06/2019 0824   BUN 6 (L) 10/06/2019 0824   CREATININE 0.88 10/06/2019 0824   CREATININE 0.80 01/02/2019 1329      Component Value Date/Time   CALCIUM 10.3 10/06/2019 0824   ALKPHOS 68 10/06/2019 0824   AST 18 10/06/2019 0824   ALT 16 10/06/2019 0824   BILITOT 0.4 10/06/2019 0824       RADIOGRAPHIC STUDIES: No results found.  ASSESSMENT AND PLAN: This is a very pleasant 65 years old white female recently diagnosed with  hemochromatosis with DNA mutation and C282Y and H63D.  The patient continues to complain of increasing fatigue and weakness as well as intermittent abdominal pain and nausea. I had a lengthy discussion with the patient and her husband about her current condition and treatment options. Her Jak 2 mutation panel was unremarkable. The patient is currently on phlebotomy to keep her ferritin level less than 50. Her CBC today is unremarkable but the ferritin level and iron study are still pending. I recommended for the patient to continue on observation with repeat blood work in 4 months. We will arrange for her to have phlebotomy sooner if her ferritin level is over 50. She was advised to call immediately if she has any concerning symptoms in the interval. The patient voices understanding of current disease status and treatment options and is in agreement with the current care plan.  All questions were answered. The patient knows to call the clinic with any problems, questions or concerns. We can certainly see the patient much sooner if necessary.  Disclaimer: This note was dictated with voice recognition software. Similar sounding words can inadvertently be transcribed and may not be corrected upon review.

## 2020-02-12 ENCOUNTER — Telehealth: Payer: Self-pay | Admitting: Internal Medicine

## 2020-02-12 NOTE — Telephone Encounter (Signed)
Scheduled per los. Called and spoke with patient. Confirmed appt 

## 2020-02-18 ENCOUNTER — Ambulatory Visit: Payer: Medicare Other | Admitting: Gastroenterology

## 2020-02-20 ENCOUNTER — Ambulatory Visit (INDEPENDENT_AMBULATORY_CARE_PROVIDER_SITE_OTHER): Payer: Medicare Other | Admitting: Gastroenterology

## 2020-02-20 ENCOUNTER — Encounter: Payer: Self-pay | Admitting: Gastroenterology

## 2020-02-20 VITALS — BP 122/76 | HR 68 | Ht 62.0 in | Wt 231.0 lb

## 2020-02-20 DIAGNOSIS — K219 Gastro-esophageal reflux disease without esophagitis: Secondary | ICD-10-CM

## 2020-02-20 DIAGNOSIS — Z8742 Personal history of other diseases of the female genital tract: Secondary | ICD-10-CM

## 2020-02-20 DIAGNOSIS — R109 Unspecified abdominal pain: Secondary | ICD-10-CM

## 2020-02-20 DIAGNOSIS — K589 Irritable bowel syndrome without diarrhea: Secondary | ICD-10-CM

## 2020-02-20 MED ORDER — DICYCLOMINE HCL 10 MG PO CAPS: 10.0000 mg | ORAL_CAPSULE | Freq: Three times a day (TID) | ORAL | 2 refills | Status: DC | PRN

## 2020-02-20 NOTE — Progress Notes (Signed)
Laurie Finley    [id number]    [birth year]  Primary Care Physician:Eason, Renae Fickle, MD  Referring Physician: [name], MD [id number] [place],  [place] [address]   Chief complaint:  IBS  HPI: 64 year old very pleasant female here for follow-up visit for IBS constipation alternating with diarrhea  She has history of endometriosis s/p ex lap and has extensive adhesions and scar tissue  She is taking Linzess 72 mcg daily.  She completed course of Xifaxan.  Overall feels her symptoms have improved to some extent.  Since last office visit in October 2021 she had 3 episodes of cramping in the abdomen and diarrhea.  Overall she feels the frequency of her bowel movements has improved.  No unintentional weight loss, dysphagia, melena or blood per rectum.  Colonoscopy October 03, 2019: For diarrhea evaluation, random colon biopsies negative for microscopic colitis.  Diverticulosis and internal hemorrhoid EGD October 03, 2019: Mild erosive esophagitis secondary to reflux, hiatal hernia, CLOtest negative for H. pylori otherwise normal exam  Outpatient Encounter Medications as of 02/20/2020  Medication Sig  . acetaminophen (TYLENOL) 500 MG tablet Take 500 mg by mouth every 6 (six) hours as needed for mild pain or moderate pain.  Marland Kitchen albuterol (VENTOLIN HFA) 108 (90 Base) MCG/ACT inhaler Inhale 1 puff into the lungs every 6 (six) hours as needed for wheezing or shortness of breath.  . Albuterol Sulfate 108 (90 Base) MCG/ACT AEPB Inhale into the lungs. Nebulizer  . amitriptyline (ELAVIL) 10 MG tablet Take 10 mg by mouth at bedtime.  Marland Kitchen amLODipine (NORVASC) 5 MG tablet Take 5 mg by mouth daily. Patient takes 1/2 in the morning and 1/2 at supper.  . Butalbital-APAP-Caff-Cod 50-300-40-30 MG CAPS Take by mouth as needed.  . Ca Carbonate-Mag Hydroxide (ROLAIDS PO) Take by mouth. Patient states that she uses 2-3 times weekly.  . Cyanocobalamin (VITAMIN B-12 IJ) Inject as directed every 21 (  twenty-one) days.   Marland Kitchen denosumab (PROLIA) 60 MG/ML SOSY injection Inject 60 mg into the skin every 6 (six) months.  . diazepam (VALIUM) 2 MG tablet Take 2 mg by mouth. Patient takes as needed for dizziness.  . diphenhydrAMINE (BENADRYL) 25 MG tablet Take 25 mg by mouth at bedtime.  . ergocalciferol (VITAMIN D2) 1.25 MG (50000 UT) capsule Take 50,000 Units by mouth once a week. Patient states that she takes 1 by mouth twice a week.  . fluticasone furoate-vilanterol (BREO ELLIPTA) 200-25 MCG/INH AEPB Inhale 1 puff into the lungs daily. Patient uses the 100/25 - 1 puff every morning.  . hyoscyamine (LEVSIN SL) 0.125 MG SL tablet Place 1 tablet (0.125 mg total) under the tongue every 4 (four) hours as needed for cramping (cramping and diarrhea).  Marland Kitchen levothyroxine (SYNTHROID) 25 MCG tablet Take 25 mcg by mouth daily.  Marland Kitchen linaclotide (LINZESS) 72 MCG capsule Take 1 capsule (72 mcg total) by mouth daily before breakfast.  . meclizine (ANTIVERT) 12.5 MG tablet Take 12.5 mg by mouth 3 (three) times daily as needed for dizziness.  Marland Kitchen METOPROLOL TARTRATE PO Take by mouth. 12.5 mg in the morning and 12.5 mg in at night.  . OXYGEN Inhale 2 L into the lungs continuous. 3 L at night  . pantoprazole (PROTONIX) 40 MG tablet Take 1 tablet (40 mg total) by mouth daily.  . pravastatin (PRAVACHOL) 10 MG tablet Take 10 mg by mouth daily.  . Wheat Dextrin (BENEFIBER ON THE GO PO) Take 1 packet by mouth.  3/3.5 gram packet daily from [facility] encounter 06/30/2019  . [DISCONTINUED] hydrocortisone (ANUSOL-HC) 25 MG suppository Place 1 suppository (25 mg total) rectally at bedtime. X 7 days  . [DISCONTINUED] rifaximin (XIFAXAN) 550 MG TABS tablet Take 1 tablet (550 mg total) by mouth 3 (three) times daily. X 14 days   No facility-administered encounter medications on file as of 02/20/2020.    Allergies as of 02/20/2020 - Review Complete 02/20/2020  Allergen Reaction Noted  . Eggs or egg-derived products Anaphylaxis and  Other (See Comments) 02/08/2009  . Adhesive [tape]  01/02/2019  . Asa [aspirin]  01/02/2019  . Asmanex (120 metered doses) [mometasone furoate]  01/02/2019  . Biaxin [clarithromycin]  01/02/2019  . Erythromycin  01/02/2019  . Evista [raloxifene hcl]  01/02/2019  . Influenza vaccines  01/02/2019  . Iodinated diagnostic agents  01/02/2019  . Nexium [esomeprazole magnesium]  01/02/2019  . Norco [hydrocodone-acetaminophen]  01/02/2019  . Oxycodone  01/02/2019  . Prilosec [omeprazole]  01/02/2019  . Qvar [beclomethasone]  01/02/2019  . Tessalon [benzonatate]  01/02/2019    Past Medical History:  Diagnosis Date  . Allergic rhinitis from [facility] encounter 06/30/2019  . Allergy    SEASONAL  . Anxiety disorder    from [facility] encounter 06/30/2019  . Arthritis of lumbosacral spine   . Asthma   . Asthma    from [facility] encounter 06/30/2019  . Cataract    BILATERAL-REMOVED  . Cervical arthritis   . Chest pain   . Colitis 11/25/1999   from [facility] encounter 06/30/2019  . Colitis due to Clostridium difficile 07/14/2003   from [facility] encounter 06/30/2019  . Colon polyp    from [facility] encounter 06/30/2019  . Diabetes (HCC)   . Diarrhea   . Dizzinesses   . Essential hypertension   . GERD (gastroesophageal reflux disease)   . H. pylori infection   . Hx of labyrinthitis 08/14/2006   from [facility] encounter 06/30/2019  . Hyperlipemia   . Hypokalemia 08/04/2003   from [facility] encounter 06/30/2019  . Hypokalemia 08/04/2003  . Hypothyroid   . Menopausal syndrome    from [facility] encounter 06/30/2019  . Neck pain    from [facility] encounter 06/30/2019  . Osteoporosis   . Palpitations   . Preoperative cardiovascular examination   . SNHL (sensorineural hearing loss)    from [facility] encounter 06/30/2019  . Stroke (HCC)   . Unsteady gait    from [facility] encounter 06/30/2019    Past Surgical History:  Procedure  Laterality Date  . ABDOMINAL HYSTERECTOMY    . ALLOGRAFT SPINE MORSELIZED Bilateral 1226/2017   from [facility] encounter 06/30/2019, DrDavid Prior  . APPENDECTOMY    . CATARACT EXTRACTION    . CHOLECYSTECTOMY    . COLONOSCOPY    . HYSTEROSCOPY WITH SALPINGOGRAM    . TONSILLECTOMY    . UPPER GASTROINTESTINAL ENDOSCOPY      Family History  Problem Relation Age of Onset  . Osteoporosis Mother   . Stroke Mother   . Pancreatic cancer Mother   . Hypertension Father   . Epilepsy Sister   . Hypertension Sister   . Epilepsy Brother   . Colon cancer Brother   . Hypertension Brother   . Liver cancer Sister   . Diabetes Sister   . Hypertension Sister   . Healthy Sister   . Hypertension Sister   . Hypertension Sister   . Hypertension Sister   .  Hypertension Sister   . Cirrhosis Sister   . Hepatitis C Sister   . Leukemia Brother   . Stomach cancer Paternal Aunt   . Esophageal cancer Neg Hx   . Rectal cancer Neg Hx     Social History   Socioeconomic History  . Marital status: Married    Spouse name: Not on file  . Number of children: Not on file  . Years of education: Not on file  . Highest education level: Not on file  Occupational History  . Not on file  Tobacco Use  . Smoking status: Never Smoker  . Smokeless tobacco: Never Used  Vaping Use  . Vaping Use: Never used  Substance and Sexual Activity  . Alcohol use: Never  . Drug use: Never  . Sexual activity: Yes    Partners: Male  Other Topics Concern  . Not on file  Social History Narrative  . Not on file   Social Determinants of Health   Financial Resource Strain: Not on file  Food Insecurity: Not on file  Transportation Needs: Not on file  Physical Activity: Not on file  Stress: Not on file  Social Connections: Not on file  Intimate Partner Violence: Not on file      Review of systems: All other review of systems negative except as mentioned in the HPI.   Physical Exam: Vitals:   02/20/20  0958  BP: 122/76  Pulse: 68   Body mass index is 42.25 kg/m. Gen:      No acute distress HEENT:  sclera anicteric Abd:      soft, non-tender; no palpable masses, no distension Ext:    No edema Neuro: alert and oriented x 3 Psych: normal mood and affect  Data Reviewed:  Reviewed labs, radiology imaging, old records and pertinent past GI work up   Assessment and Plan/Recommendations:  64 year old very pleasant female with history of endometriosis, ex lap, TAH, BSO, appendectomy and cholecystectomy with extensive intra-abdominal and pelvic adhesions with irritable bowel syndrome with alternating constipation and diarrhea  Continue Linzess 72 mcg daily Continue Benefiber 1 teaspoon 3 times daily with meals Increase water intake  Intermittent abdominal cramping: Use dicyclomine 10 mg every 8 hours as needed  GERD: Continue Protonix and antireflux measures  Return in 4 to 6 months or sooner if needed   The patient was provided an opportunity to ask questions and all were answered. The patient agreed with the plan and demonstrated an understanding of the instructions.  Iona Beard , MD    CC: [name], MD

## 2020-02-20 NOTE — Patient Instructions (Addendum)
We have sent the following medications to your pharmacy for you to pick up at your convenience:  Dicyclomine 10 mg every 8 hours as needed for abdominal cramping/pain  Continue Linzess 72 mcg daily.  Continue Protonix.  Follow up with Dr Lavon Paganini in 4-6 months.  If you are age 65 or older, your body mass index should be between 23-30. Your Body mass index is 42.25 kg/m. If this is out of the aforementioned range listed, please consider follow up with your Primary Care Provider.  Due to recent changes in healthcare laws, you may see the results of your imaging and laboratory studies on MyChart before your provider has had a chance to review them.  We understand that in some cases there may be results that are confusing or concerning to you. Not all laboratory results come back in the same time frame and the provider may be waiting for multiple results in order to interpret others.  Please give [place] 48 hours in order for your provider to thoroughly review all the results before contacting the office for clarification of your results.

## 2020-02-23 ENCOUNTER — Encounter: Payer: Self-pay | Admitting: Gastroenterology

## 2020-05-25 ENCOUNTER — Encounter: Payer: Self-pay | Admitting: Gastroenterology

## 2020-05-25 ENCOUNTER — Ambulatory Visit (INDEPENDENT_AMBULATORY_CARE_PROVIDER_SITE_OTHER)
Admission: RE | Admit: 2020-05-25 | Discharge: 2020-05-25 | Disposition: A | Discharge status: Home / Self Care | Payer: Medicare Other | Source: Ambulatory Visit | Attending: Gastroenterology | Admitting: Gastroenterology

## 2020-05-25 ENCOUNTER — Ambulatory Visit (INDEPENDENT_AMBULATORY_CARE_PROVIDER_SITE_OTHER): Payer: Medicare Other | Admitting: Gastroenterology

## 2020-05-25 ENCOUNTER — Other Ambulatory Visit: Payer: Self-pay

## 2020-05-25 VITALS — BP 116/70 | HR 70 | Ht 62.0 in | Wt 222.0 lb

## 2020-05-25 DIAGNOSIS — R109 Unspecified abdominal pain: Secondary | ICD-10-CM

## 2020-05-25 DIAGNOSIS — K581 Irritable bowel syndrome with constipation: Secondary | ICD-10-CM

## 2020-05-25 DIAGNOSIS — R1032 Left lower quadrant pain: Secondary | ICD-10-CM

## 2020-05-25 MED ORDER — ONDANSETRON 4 MG PO TBDP: 4.0000 mg | ORAL_TABLET | Freq: Every day | ORAL | 3 refills | Status: DC

## 2020-05-25 NOTE — Patient Instructions (Addendum)
Your provider has requested that you have an abdominal x ray before leaving today. Please go to the basement floor to our Radiology department for the test.  Dr Lavon Paganini  recommends that you complete a bowel purge (to clean out your bowels). Please do the following: Purchase a bottle of Miralax over the counter as well as a box of 5 mg dulcolax tablets. Take 4 dulcolax tablets. Wait 1 hour. You will then drink 6-8 capfuls of Miralax mixed in an adequate amount of water/juice/gatorade (you may choose which of these liquids to drink) over the next 2-3 hours. You should expect results within 1 to 6 hours after completing the bowel purge ( Only do after we contact you with your xray results)  We are giving you samples of Linzess 145 mcg to try, if they work for you call the office to have [place] send in a prescription  Follow up in 3 months   Due to recent changes in healthcare laws, you may see the results of your imaging and laboratory studies on MyChart before your provider has had a chance to review them.  We understand that in some cases there may be results that are confusing or concerning to you. Not all laboratory results come back in the same time frame and the provider may be waiting for multiple results in order to interpret others.  Please give [place] 48 hours in order for your provider to thoroughly review all the results before contacting the office for clarification of your results.   I appreciate the  opportunity to care for you  Thank You   Marsa Aris , MD

## 2020-05-25 NOTE — Progress Notes (Signed)
Laurie Finley Finley    [id number]    1956-01-21  Primary Care Physician:Eason, Renae FicklePaul, MD  Referring Physician: Kathlee NationsEason, Paul, MD [id number] [facility] [address] [place],  [place] [id number]   Chief complaint: Irritable bowel syndrome with alternating constipation and diarrhea, left lower quadrant abdominal pain  HPI:  65 year old very pleasant female here for follow-up visit for IBS constipation alternating with diarrhea and left lower quadrant abdominal pain  She has history of endometriosis s/p ex lap and has extensive adhesions and scar tissue  Diarrhea improved after she completed course of Xifaxan.  A1c went up after she stated using steroid inhler, A1c >9 and was recently started on Metformin.  After she started taking Metformin she had significant diarrhea but she feels she is getting used to it and diarrhea has resolved.  She is back to her baseline bowel habits.  She has on average 2-3 bowel movements a day and she sometimes skips a day in between  Nausea on and off, takes Meclizine as needed.  It was worse when she was taking antibiotics  She is currently taking Linzess 72 mcg daily, feels overall better in terms of her bowel habits but she is having left-sided abdominal pain which is more recent.  She has history of osteoporosis and arthritis in the hip.  She has right lower quadrant pain radiating to the groin and her leg No unintentional weight loss, dysphagia, melena or blood per rectum.  Dicyclomine is helping with abdominal cramping, she has less frequent episodes.  History of hemochromatosis, gets periodic phlebotomy for iron chelation.  On B12 injections.  Colonoscopy October 03, 2019: For diarrhea evaluation, random colon biopsies negative for microscopic colitis. Diverticulosis and internal hemorrhoid EGD October 03, 2019: Mild erosive esophagitis secondary to reflux, hiatal hernia, CLOtest negative for H. pylori otherwise normal exam   Outpatient Encounter Medications as  of 05/25/2020  Medication Sig  . acetaminophen (TYLENOL) 500 MG tablet Take 500 mg by mouth every 6 (six) hours as needed for mild pain or moderate pain.  Marland Kitchen. albuterol (VENTOLIN HFA) 108 (90 Base) MCG/ACT inhaler Inhale 1 puff into the lungs every 6 (six) hours as needed for wheezing or shortness of breath.  . Albuterol Sulfate 108 (90 Base) MCG/ACT AEPB Inhale into the lungs. Nebulizer  . amitriptyline (ELAVIL) 10 MG tablet Take 10 mg by mouth at bedtime.  Marland Kitchen. amLODipine (NORVASC) 5 MG tablet Take 5 mg by mouth daily. Patient takes 1/2 in the morning and 1/2 at supper.  . Butalbital-APAP-Caff-Cod 50-300-40-30 MG CAPS Take by mouth as needed.  . Ca Carbonate-Mag Hydroxide (ROLAIDS PO) Take by mouth. Patient states that she uses 2-3 times weekly.  . Cyanocobalamin (VITAMIN B-12 IJ) Inject as directed every 21 ( twenty-one) days.   Marland Kitchen. denosumab (PROLIA) 60 MG/ML SOSY injection Inject 60 mg into the skin every 6 (six) months.  . diazepam (VALIUM) 2 MG tablet Take 2 mg by mouth. Patient takes as needed for dizziness.  . dicyclomine (BENTYL) 10 MG capsule Take 1 capsule (10 mg total) by mouth every 8 (eight) hours as needed for spasms.  . diphenhydrAMINE (BENADRYL) 25 MG tablet Take 25 mg by mouth at bedtime.  . ergocalciferol (VITAMIN D2) 1.25 MG (50000 UT) capsule Take 50,000 Units by mouth once a week. Patient states that she takes 1 by mouth twice a week.  . fluticasone furoate-vilanterol (BREO ELLIPTA) 200-25 MCG/INH AEPB Inhale 1 puff into the lungs daily. Patient uses the 100/25 -  1 puff every morning.  . hyoscyamine (LEVSIN SL) 0.125 MG SL tablet Place 1 tablet (0.125 mg total) under the tongue every 4 (four) hours as needed for cramping (cramping and diarrhea).  Marland Kitchen levothyroxine (SYNTHROID) 25 MCG tablet Take 25 mcg by mouth daily.  Marland Kitchen linaclotide (LINZESS) 72 MCG capsule Take 1 capsule (72 mcg total) by mouth daily before breakfast.  . meclizine (ANTIVERT) 12.5 MG tablet Take 12.5 mg by mouth 3  (three) times daily as needed for dizziness.  Marland Kitchen METOPROLOL TARTRATE PO Take by mouth. 12.5 mg in the morning and 12.5 mg in at night.  . OXYGEN Inhale 2 L into the lungs continuous. 3 L at night  . pantoprazole (PROTONIX) 40 MG tablet Take 1 tablet (40 mg total) by mouth daily.  . pravastatin (PRAVACHOL) 10 MG tablet Take 10 mg by mouth daily.  . Wheat Dextrin (BENEFIBER ON THE GO PO) Take 1 packet by mouth. 3/3.5 gram packet daily from [facility] encounter 06/30/2019   No facility-administered encounter medications on file as of 05/25/2020.    Allergies as of 05/25/2020 - Review Complete 02/23/2020  Allergen Reaction Noted  . Eggs or egg-derived products Anaphylaxis and Other (See Comments) 02/08/2009  . Adhesive [tape]  01/02/2019  . Asa [aspirin]  01/02/2019  . Asmanex (120 metered doses) [mometasone furoate]  01/02/2019  . Biaxin [clarithromycin]  01/02/2019  . Erythromycin  01/02/2019  . Evista [raloxifene hcl]  01/02/2019  . Influenza vaccines  01/02/2019  . Iodinated diagnostic agents  01/02/2019  . Nexium [esomeprazole magnesium]  01/02/2019  . Norco [hydrocodone-acetaminophen]  01/02/2019  . Oxycodone  01/02/2019  . Prilosec [omeprazole]  01/02/2019  . Qvar [beclomethasone]  01/02/2019  . Tessalon [benzonatate]  01/02/2019    Past Medical History:  Diagnosis Date  . Allergic rhinitis from [facility] encounter 06/30/2019  . Allergy    SEASONAL  . Anxiety disorder    from [facility] encounter 06/30/2019  . Arthritis of lumbosacral spine   . Asthma   . Asthma    from [facility] encounter 06/30/2019  . Cataract    BILATERAL-REMOVED  . Cervical arthritis   . Chest pain   . Colitis 11/25/1999   from [facility] encounter 06/30/2019  . Colitis due to Clostridium difficile 07/14/2003   from [facility] encounter 06/30/2019  . Colon polyp    from [facility] encounter 06/30/2019  . Diabetes (HCC)   . Diarrhea   . Dizzinesses   . Essential  hypertension   . GERD (gastroesophageal reflux disease)   . H. pylori infection   . Hx of labyrinthitis 08/14/2006   from [facility] encounter 06/30/2019  . Hyperlipemia   . Hypokalemia 08/04/2003   from [facility] encounter 06/30/2019  . Hypokalemia 08/04/2003  . Hypothyroid   . Menopausal syndrome    from [facility] encounter 06/30/2019  . Neck pain    from [facility] encounter 06/30/2019  . Osteoporosis   . Palpitations   . Preoperative cardiovascular examination   . SNHL (sensorineural hearing loss)    from [facility] encounter 06/30/2019  . Stroke (HCC)   . Unsteady gait    from [facility] encounter 06/30/2019    Past Surgical History:  Procedure Laterality Date  . ABDOMINAL HYSTERECTOMY    . ALLOGRAFT SPINE MORSELIZED Bilateral 1226/2017   from [facility] encounter 06/30/2019, DrDavid Prior  . APPENDECTOMY    . CATARACT EXTRACTION    . CHOLECYSTECTOMY    . COLONOSCOPY    .  HYSTEROSCOPY WITH SALPINGOGRAM    . TONSILLECTOMY    . UPPER GASTROINTESTINAL ENDOSCOPY      Family History  Problem Relation Age of Onset  . Osteoporosis Mother   . Stroke Mother   . Pancreatic cancer Mother   . Hypertension Father   . Epilepsy Sister   . Hypertension Sister   . Epilepsy Brother   . Colon cancer Brother   . Hypertension Brother   . Liver cancer Sister   . Diabetes Sister   . Hypertension Sister   . Healthy Sister   . Hypertension Sister   . Hypertension Sister   . Hypertension Sister   . Hypertension Sister   . Cirrhosis Sister   . Hepatitis C Sister   . Leukemia Brother   . Stomach cancer Paternal Aunt   . Esophageal cancer Neg Hx   . Rectal cancer Neg Hx     Social History   Socioeconomic History  . Marital status: Married    Spouse name: Not on file  . Number of children: Not on file  . Years of education: Not on file  . Highest education level: Not on file  Occupational History  . Not on file  Tobacco Use  . Smoking status:  Never Smoker  . Smokeless tobacco: Never Used  Vaping Use  . Vaping Use: Never used  Substance and Sexual Activity  . Alcohol use: Never  . Drug use: Never  . Sexual activity: Yes    Partners: Male  Other Topics Concern  . Not on file  Social History Narrative  . Not on file   Social Determinants of Health   Financial Resource Strain: Not on file  Food Insecurity: Not on file  Transportation Needs: Not on file  Physical Activity: Not on file  Stress: Not on file  Social Connections: Not on file  Intimate Partner Violence: Not on file      Review of systems: All other review of systems negative except as mentioned in the HPI.   Physical Exam: Vitals:   05/25/20 0917  BP: 116/70  Pulse: 70   Body mass index is 40.6 kg/m. Gen:      No acute distress, uses walker and is on continuous oxygen HEENT:  sclera anicteric Abd:      soft, mild distention and palpable colon on the left side, possible increased stool burden, mild left lower quadrant tenderness. Ext:   Mild edema Neuro: alert and oriented x 3 Psych: normal mood and affect  Data Reviewed:  Reviewed labs, radiology imaging, old records and pertinent past GI work up   Assessment and Plan/Recommendations:  65 year old very pleasant female with history of endometriosis, ex lap, TAH, BSO, appendectomy and cholecystectomy with extensive intra-abdominal and pelvic adhesions with irritable bowel syndrome with predominant constipation  Left lower quadrant abdominal pain, will obtain abdominal x-ray to exclude increased stool burden.  If positive will plan to do bowel purge with MiraLAX  We will give samples for higher dose Linzess 145 mcg daily to see if she has improvement of constipation with it, if she feels better with higher dose lenses will switch the prescription to 145 mcg daily  Continue Benefiber 1 teaspoon 3 times daily with meals Increase water intake  Nausea: Use Zofran ODT 4 mg daily as  needed  Intermittent abdominal cramping: Use dicyclomine 10 mg every 8 hours as needed  GERD: Continue Protonix and antireflux measures  Return in 2 to 3 months or sooner if needed  This  visit required 40 minutes of patient care (this includes precharting, chart review, review of results, face-to-face time used for counseling as well as treatment plan and follow-up. The patient was provided an opportunity to ask questions and all were answered. The patient agreed with the plan and demonstrated an understanding of the instructions.  Iona Beard , MD    CC: Laurie Nations, MD

## 2020-06-14 ENCOUNTER — Inpatient Hospital Stay: Payer: Medicare Other

## 2020-06-14 ENCOUNTER — Other Ambulatory Visit: Payer: Self-pay

## 2020-06-14 ENCOUNTER — Inpatient Hospital Stay: Payer: Medicare Other | Attending: Internal Medicine | Admitting: Internal Medicine

## 2020-06-14 ENCOUNTER — Encounter: Payer: Self-pay | Admitting: Internal Medicine

## 2020-06-14 LAB — CBC WITH DIFFERENTIAL (CANCER CENTER ONLY)
Abs Immature Granulocytes: 0.04 10*3/uL (ref 0.00–0.07)
Basophils Absolute: 0.1 10*3/uL (ref 0.0–0.1)
Basophils Relative: 1 %
Eosinophils Absolute: 0.2 10*3/uL (ref 0.0–0.5)
Eosinophils Relative: 2 %
HCT: 44.2 % (ref 36.0–46.0)
Hemoglobin: 14.5 g/dL (ref 12.0–15.0)
Immature Granulocytes: 0 %
Lymphocytes Relative: 21 %
Lymphs Abs: 2.2 10*3/uL (ref 0.7–4.0)
MCH: 28.4 pg (ref 26.0–34.0)
MCHC: 32.8 g/dL (ref 30.0–36.0)
MCV: 86.5 fL (ref 80.0–100.0)
Monocytes Absolute: 0.8 10*3/uL (ref 0.1–1.0)
Monocytes Relative: 8 %
Neutro Abs: 7 10*3/uL (ref 1.7–7.7)
Neutrophils Relative %: 68 %
Platelet Count: 210 10*3/uL (ref 150–400)
RBC: 5.11 MIL/uL (ref 3.87–5.11)
RDW: 13 % (ref 11.5–15.5)
WBC Count: 10.1 10*3/uL (ref 4.0–10.5)
nRBC: 0 % (ref 0.0–0.2)

## 2020-06-14 LAB — CMP (CANCER CENTER ONLY)
ALT: 18 U/L (ref 0–44)
AST: 18 U/L (ref 15–41)
Albumin: 4.1 g/dL (ref 3.5–5.0)
Alkaline Phosphatase: 59 U/L (ref 38–126)
Anion gap: 13 (ref 5–15)
BUN: 9 mg/dL (ref 8–23)
CO2: 25 mmol/L (ref 22–32)
Calcium: 10 mg/dL (ref 8.9–10.3)
Chloride: 102 mmol/L (ref 98–111)
Creatinine: 0.89 mg/dL (ref 0.44–1.00)
GFR, Estimated: >60 mL/min (ref >60)
Glucose, Bld: 141 mg/dL — ABNORMAL HIGH (ref 70–99)
Potassium: 4 mmol/L (ref 3.5–5.1)
Sodium: 140 mmol/L (ref 135–145)
Total Bilirubin: 0.4 mg/dL (ref 0.3–1.2)
Total Protein: 7.6 g/dL (ref 6.5–8.1)

## 2020-06-14 LAB — FERRITIN: Ferritin: 91 ng/mL (ref 11–307)

## 2020-06-14 LAB — IRON AND TIBC
Iron: 113 ug/dL (ref 41–142)
Saturation Ratios: 31 % (ref 21–57)
TIBC: 368 ug/dL (ref 236–444)
UIBC: 255 ug/dL (ref 120–384)

## 2020-06-14 NOTE — Patient Instructions (Signed)

## 2020-06-14 NOTE — Progress Notes (Signed)
[facility] [phone]   [phone]  OFFICE PROGRESS NOTE  [name], MD [address] 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  She has negative JAK2 mutation panel.  PRIOR THERAPY: None  CURRENT THERAPY: Phlebotomy on as-needed basis.  INTERVAL HISTORY: Laurie Finley 65 y.o. female returns to the clinic today for follow-up visit accompanied by her husband.  The patient is feeling fine today with no concerning complaints except for the intermittent abdominal pain.  She was seen by gastroenterology and she had EGD and colonoscopy that were unremarkable.  She is currently on Linzess.  She continues to have few episodes of constipation and bloating.  She denied having any current nausea, vomiting or diarrhea.  She has no chest pain, shortness of breath, cough or hemoptysis.  She is here today for evaluation with repeat CBC, iron study and ferritin for evaluation of her condition.   MEDICAL HISTORY: Past Medical History:  Diagnosis Date  . Allergic rhinitis from [facility] encounter 06/30/2019  . Allergy    SEASONAL  . Anxiety disorder    from [facility] encounter 06/30/2019  . Arthritis of lumbosacral spine   . Asthma   . Asthma    from [facility] encounter 06/30/2019  . Cataract    BILATERAL-REMOVED  . Cervical arthritis   . Chest pain   . Colitis 11/25/1999   from [facility] encounter 06/30/2019  . Colitis due to Clostridium difficile 07/14/2003   from [facility] encounter 06/30/2019  . Colon polyp    from [facility] encounter 06/30/2019  . Diabetes (HCC)   . Diarrhea   . Dizzinesses   . Essential hypertension   . GERD (gastroesophageal reflux disease)   . H. pylori infection   . Hx of labyrinthitis 08/14/2006   from [facility] encounter 06/30/2019  . Hyperlipemia   .  Hypokalemia 08/04/2003   from [facility] encounter 06/30/2019  . Hypokalemia 08/04/2003  . Hypothyroid   . Menopausal syndrome    from [facility] encounter 06/30/2019  . Neck pain    from [facility] encounter 06/30/2019  . Osteoporosis   . Palpitations   . Preoperative cardiovascular examination   . SNHL (sensorineural hearing loss)    from [facility] encounter 06/30/2019  . Stroke (HCC)   . Unsteady gait    from [facility] encounter 06/30/2019    ALLERGIES:  is allergic to eggs or egg-derived products, adhesive [tape], asa [aspirin], asmanex (120 metered doses) [mometasone furoate], biaxin [clarithromycin], erythromycin, evista [raloxifene hcl], influenza vaccines, iodinated diagnostic agents, nexium [esomeprazole magnesium], norco [hydrocodone-acetaminophen], oxycodone, prilosec [omeprazole], qvar [beclomethasone], and tessalon [benzonatate].  MEDICATIONS:  Current Outpatient Medications  Medication Sig Dispense Refill  . acetaminophen (TYLENOL) 500 MG tablet Take 500 mg by mouth every 6 (six) hours as needed for mild pain or moderate pain.    Marland Kitchen albuterol (VENTOLIN HFA) 108 (90 Base) MCG/ACT inhaler Inhale 1 puff into the lungs every 6 (six) hours as needed for wheezing or shortness of breath.    . Albuterol Sulfate 108 (90 Base) MCG/ACT AEPB Inhale into the lungs. Nebulizer    . amitriptyline (ELAVIL) 10 MG tablet Take 10 mg by mouth at bedtime.    Marland Kitchen amLODipine (NORVASC) 5 MG tablet Take 5 mg by mouth daily. Patient takes 1/2 in the morning and 1/2 at supper.    Marland Kitchen  arformoterol (BROVANA) 15 MCG/2ML NEBU Inhale 15 mcg into the lungs in the morning and at bedtime.    . Butalbital-APAP-Caff-Cod 50-300-40-30 MG CAPS Take by mouth as needed.    . Ca Carbonate-Mag Hydroxide (ROLAIDS PO) Take by mouth. Patient states that she uses 2-3 times weekly.    . Cyanocobalamin (VITAMIN B-12 IJ) Inject as directed every 21 ( twenty-one) days.     Marland Kitchen denosumab (PROLIA) 60 MG/ML SOSY  injection Inject 60 mg into the skin every 6 (six) months.    . diazepam (VALIUM) 2 MG tablet Take 2 mg by mouth. Patient takes as needed for dizziness.    . dicyclomine (BENTYL) 10 MG capsule Take 1 capsule (10 mg total) by mouth every 8 (eight) hours as needed for spasms. 30 capsule 2  . diphenhydrAMINE (BENADRYL) 25 MG tablet Take 25 mg by mouth at bedtime.    . ergocalciferol (VITAMIN D2) 1.25 MG (50000 UT) capsule Take 50,000 Units by mouth once a week. Patient states that she takes 1 by mouth twice a week.    . levothyroxine (SYNTHROID) 25 MCG tablet Take 25 mcg by mouth daily.    Marland Kitchen linaclotide (LINZESS) 72 MCG capsule Take 1 capsule (72 mcg total) by mouth daily before breakfast. 30 capsule 3  . meclizine (ANTIVERT) 12.5 MG tablet Take 12.5 mg by mouth 3 (three) times daily as needed for dizziness.    . metFORMIN (GLUCOPHAGE-XR) 500 MG 24 hr tablet Take 2 tablets by mouth in the morning and at bedtime.    Marland Kitchen METOPROLOL TARTRATE PO Take by mouth. 12.5 mg in the morning and 12.5 mg in at night.    . ondansetron (ZOFRAN ODT) 4 MG disintegrating tablet Take 1 tablet (4 mg total) by mouth daily. As needed 30 tablet 3  . OXYGEN Inhale 2 L into the lungs continuous. 3 L at night    . pantoprazole (PROTONIX) 40 MG tablet Take 1 tablet (40 mg total) by mouth daily. 90 tablet 3  . pravastatin (PRAVACHOL) 10 MG tablet Take 10 mg by mouth daily.    . Wheat Dextrin (BENEFIBER ON THE GO PO) Take 1 packet by mouth. 3/3.5 gram packet daily from [facility] encounter 06/30/2019     No current facility-administered medications for this visit.    SURGICAL HISTORY:  Past Surgical History:  Procedure Laterality Date  . ABDOMINAL HYSTERECTOMY    . ALLOGRAFT SPINE MORSELIZED Bilateral 1226/2017   from [facility] encounter 06/30/2019, DrDavid Prior  . APPENDECTOMY    . CATARACT EXTRACTION    . CHOLECYSTECTOMY    . COLONOSCOPY    . HYSTEROSCOPY WITH SALPINGOGRAM    . TONSILLECTOMY    . UPPER  GASTROINTESTINAL ENDOSCOPY      REVIEW OF SYSTEMS:  A comprehensive review of systems was negative except for: Constitutional: positive for fatigue Gastrointestinal: positive for abdominal pain   PHYSICAL EXAMINATION: General appearance: alert, cooperative, fatigued and no distress Head: Normocephalic, without obvious abnormality, atraumatic Neck: no adenopathy, no JVD, supple, symmetrical, trachea midline and thyroid not enlarged, symmetric, no tenderness/mass/nodules Lymph nodes: Cervical, supraclavicular, and axillary nodes normal. Resp: clear to auscultation bilaterally Back: symmetric, no curvature. ROM normal. No CVA tenderness. Cardio: regular rate and rhythm, S1, S2 normal, no murmur, click, rub or gallop GI: soft, non-tender; bowel sounds normal; no masses,  no organomegaly Extremities: extremities normal, atraumatic, no cyanosis or edema  ECOG PERFORMANCE STATUS: 1 - Symptomatic but completely ambulatory  Blood pressure 138/88, pulse 84, temperature 97.7 F (  36.5 C), temperature source Tympanic, resp. rate 17, height 5\' 2"  (1.575 m), weight 219 lb 12.8 oz (99.7 kg), SpO2 100 %.  LABORATORY DATA: Lab Results  Component Value Date   WBC 10.1 06/14/2020   HGB 14.5 06/14/2020   HCT 44.2 06/14/2020   MCV 86.5 06/14/2020   PLT 210 06/14/2020      Chemistry      Component Value Date/Time   NA 141 10/06/2019 0824   K 3.6 10/06/2019 0824   CL 106 10/06/2019 0824   CO2 26 10/06/2019 0824   BUN 6 (L) 10/06/2019 0824   CREATININE 0.88 10/06/2019 0824   CREATININE 0.80 01/02/2019 1329      Component Value Date/Time   CALCIUM 10.3 10/06/2019 0824   ALKPHOS 68 10/06/2019 0824   AST 18 10/06/2019 0824   ALT 16 10/06/2019 0824   BILITOT 0.4 10/06/2019 0824       RADIOGRAPHIC STUDIES: DG Abd 2 Views  Result Date: 05/26/2020 CLINICAL DATA:  Constipation. EXAM: ABDOMEN - 2 VIEW COMPARISON:  August 15, 2019 FINDINGS: There is a large amount of stool in the right hemicolon.  The bowel gas pattern is nonobstructive. There are no radiopaque kidney stones. Degenerative changes are noted of the hips and spine. The patient appears to be status post prior cholecystectomy. IMPRESSION: Large amount of stool in the right hemicolon. Electronically Signed   By: August 17, 2019 M.D.   On: 05/26/2020 12:41    ASSESSMENT AND PLAN: This is a very pleasant 65 years old white female recently diagnosed with hemochromatosis with DNA mutation and C282Y and H63D.  The patient continues to complain of increasing fatigue and weakness as well as intermittent abdominal pain and nausea. I had a lengthy discussion with the patient and her husband about her current condition and treatment options. Her Jak 2 mutation panel was unremarkable. The patient is currently on phlebotomy to keep her ferritin level less than 50. The patient is doing fine today with no concerning complaints except for the unexplained intermittent abdominal pain.  She is evaluated by gastroenterology and no clear etiology for her condition. I recommended for her to proceed with phlebotomy today as planned. I will see her back for follow-up visit in 4 months for evaluation with repeat CBC, iron study and ferritin as well as comprehensive metabolic panel and phlebotomy if needed. The patient was advised to call immediately if she has any other concerning symptoms in the interval. The patient voices understanding of current disease status and treatment options and is in agreement with the current care plan.  All questions were answered. The patient knows to call the clinic with any problems, questions or concerns. We can certainly see the patient much sooner if necessary.  Disclaimer: This note was dictated with voice recognition software. Similar sounding words can inadvertently be transcribed and may not be corrected upon review.

## 2020-06-14 NOTE — Progress Notes (Signed)
Ok to proceed with phlebotomy based on Ferritin level from dec 2021.

## 2020-06-14 NOTE — Progress Notes (Signed)
Patient presented today for phlebotomy per MD order. A 16 gauge needle was placed in the Right AC and of blood was removed. Needle was removed fully intact.  Patient observed 30 minutes post observation with no complaints. Snack and beverage provided.

## 2020-06-14 NOTE — Progress Notes (Signed)
Noted that BP was low after Phlebotomy. Pt stood up at pt reports that she feels fine with no dizziness. Pt currently drink water and she was encouraged to increase her fluid intake today. She walked out to lobby with NT using her walker

## 2020-06-16 ENCOUNTER — Telehealth: Payer: Self-pay | Admitting: Internal Medicine

## 2020-06-16 NOTE — Telephone Encounter (Signed)
Scheduled per los. Called and spoke with patient. Confirmed appts  

## 2020-07-15 ENCOUNTER — Telehealth: Payer: Self-pay | Admitting: Gastroenterology

## 2020-07-15 NOTE — Telephone Encounter (Signed)
Inbound call from patient stating she is having stabbing like pains on her left side an wants to know what to do.  She has an appt scheduled with Dr. Lavon Paganini for July but states will not be able to wait until then.  Please advise.

## 2020-07-16 NOTE — Telephone Encounter (Signed)
Left message on machine to call back  

## 2020-07-22 NOTE — Telephone Encounter (Signed)
Patient reports pain has subsided.  She will call back if the pain returns.

## 2020-09-25 ENCOUNTER — Other Ambulatory Visit: Payer: Self-pay | Admitting: Gastroenterology

## 2020-10-01 ENCOUNTER — Encounter: Payer: Self-pay | Admitting: Gastroenterology

## 2020-10-01 ENCOUNTER — Ambulatory Visit (INDEPENDENT_AMBULATORY_CARE_PROVIDER_SITE_OTHER): Payer: Medicare Other | Admitting: Gastroenterology

## 2020-10-01 ENCOUNTER — Other Ambulatory Visit: Payer: Medicare Other

## 2020-10-01 VITALS — BP 110/70 | HR 68 | Ht 62.75 in | Wt 213.1 lb

## 2020-10-01 DIAGNOSIS — R110 Nausea: Secondary | ICD-10-CM

## 2020-10-01 DIAGNOSIS — R1012 Left upper quadrant pain: Secondary | ICD-10-CM | POA: Diagnosis not present

## 2020-10-01 DIAGNOSIS — Z8379 Family history of other diseases of the digestive system: Secondary | ICD-10-CM

## 2020-10-01 NOTE — Progress Notes (Signed)
Laurie Finley    [id number]    02-03-1956  Primary Care Physician:Eason, Renae Fickle, MD  Referring Physician: [name], MD [id number] [facility] [address] [place],  [place] [address]   Chief complaint:  LUQ pain, Nausea and vomiting  HPI:  65 year old very pleasant female here for follow-up visit with complaints of nausea, vomiting and left lower quadrant abdominal pain  She is having nausea almost daily, worse as the day goes by usually comes on immediately after a meal or during a meal.  She occasionally has vomiting, she tries to not vomit even though she has a sick feeling all the time.  She feels full in the left upper quadrant and he has been having constant discomfort.   She has history of endometriosis s/p ex lap and has extensive adhesions and scar tissue  History of alternating constipation and diarrhea, she is currently having almost daily bowel movement on Linzess 72 mcg daily and soluble fiber supplement   Diarrhea improved after she completed course of Xifaxan.   A1c went up after she stated using steroid inhler, A1c >9 and was recently started on Metformin.    Nausea on and off, takes Meclizine as needed.  It was worse when she was taking antibiotics    Dicyclomine is helping with abdominal cramping, she has less frequent episodes.   History of hemochromatosis, gets periodic phlebotomy for iron chelation.   Her niece and nephew recently got tested positive for celiac disease and she is worried if she has it.  CT abdomen and pelvis 08/15/2019 No CT findings to account for the patient's chronic abdominal pain. Status post cholecystectomy, appendectomy, and hysterectomy.   Colonoscopy October 03, 2019: For diarrhea evaluation, random colon biopsies negative for microscopic colitis.  Diverticulosis and internal hemorrhoid EGD October 03, 2019: Mild erosive esophagitis secondary to reflux, hiatal hernia, CLOtest negative for H. pylori otherwise normal exam   Outpatient  Encounter Medications as of 10/01/2020  Medication Sig   acetaminophen (TYLENOL) 500 MG tablet Take 500 mg by mouth every 6 (six) hours as needed for mild pain or moderate pain.   albuterol (VENTOLIN HFA) 108 (90 Base) MCG/ACT inhaler Inhale 1 puff into the lungs every 6 (six) hours as needed for wheezing or shortness of breath.   Albuterol Sulfate 108 (90 Base) MCG/ACT AEPB Inhale into the lungs. Nebulizer   amitriptyline (ELAVIL) 10 MG tablet Take 10 mg by mouth at bedtime.   amLODipine (NORVASC) 5 MG tablet Take 5 mg by mouth daily. Patient takes 1/2 in the morning and 1/2 at supper.   arformoterol (BROVANA) 15 MCG/2ML NEBU Inhale 15 mcg into the lungs in the morning and at bedtime.   Butalbital-APAP-Caff-Cod 50-300-40-30 MG CAPS Take by mouth as needed.   Ca Carbonate-Mag Hydroxide (ROLAIDS PO) Take by mouth. Patient states that she uses 2-3 times weekly.   Cyanocobalamin (VITAMIN B-12 IJ) Inject as directed every 21 ( twenty-one) days.    denosumab (PROLIA) 60 MG/ML SOSY injection Inject 60 mg into the skin every 6 (six) months.   diazepam (VALIUM) 2 MG tablet Take 2 mg by mouth. Patient takes as needed for dizziness.   dicyclomine (BENTYL) 10 MG capsule TAKE 1 CAPSULE (10 MG TOTAL) BY MOUTH EVERY 8 (EIGHT) HOURS AS NEEDED FOR SPASMS.   diphenhydrAMINE (BENADRYL) 25 MG tablet Take 25 mg by mouth at bedtime.   ergocalciferol (VITAMIN D2) 1.25 MG (50000 UT) capsule Take 50,000 Units by mouth once a week. Patient  states that she takes 1 by mouth twice a week.   levothyroxine (SYNTHROID) 25 MCG tablet Take 25 mcg by mouth daily.   linaclotide (LINZESS) 72 MCG capsule Take 1 capsule (72 mcg total) by mouth daily before breakfast.   meclizine (ANTIVERT) 12.5 MG tablet Take 12.5 mg by mouth 3 (three) times daily as needed for dizziness.   metFORMIN (GLUCOPHAGE-XR) 500 MG 24 hr tablet Take 2 tablets by mouth in the morning and at bedtime.   METOPROLOL TARTRATE PO Take by mouth. 12.5 mg in the morning  and 12.5 mg in at night.   OXYGEN Inhale 2 L into the lungs continuous. 3 L at night   pantoprazole (PROTONIX) 40 MG tablet TAKE 1 TABLET BY MOUTH EVERY DAY   pravastatin (PRAVACHOL) 10 MG tablet Take 10 mg by mouth daily.   Wheat Dextrin (BENEFIBER ON THE GO PO) Take 1 packet by mouth. 3/3.5 gram packet daily from [facility] encounter 06/30/2019   [DISCONTINUED] ondansetron (ZOFRAN ODT) 4 MG disintegrating tablet Take 1 tablet (4 mg total) by mouth daily. As needed   No facility-administered encounter medications on file as of 10/01/2020.    Allergies as of 10/01/2020 - Review Complete 05/25/2020  Allergen Reaction Noted   Eggs or egg-derived products Anaphylaxis and Other (See Comments) 02/08/2009   Adhesive [tape]  01/02/2019   Asa [aspirin]  01/02/2019   Asmanex (120 metered doses) [mometasone furoate]  01/02/2019   Biaxin [clarithromycin]  01/02/2019   Erythromycin  01/02/2019   Evista [raloxifene hcl]  01/02/2019   Influenza vaccines  01/02/2019   Iodinated diagnostic agents  01/02/2019   Nexium [esomeprazole magnesium]  01/02/2019   Norco [hydrocodone-acetaminophen]  01/02/2019   Oxycodone  01/02/2019   Prilosec [omeprazole]  01/02/2019   Qvar [beclomethasone]  01/02/2019   Tessalon [benzonatate]  01/02/2019    Past Medical History:  Diagnosis Date   Allergic rhinitis from [facility] encounter 06/30/2019   Allergy    SEASONAL   Anxiety disorder    from [facility] encounter 06/30/2019   Arthritis of lumbosacral spine    Asthma    Asthma    from [facility] encounter 06/30/2019   Cataract    BILATERAL-REMOVED   Cervical arthritis    Chest pain    Colitis 11/25/1999   from [facility] encounter 06/30/2019   Colitis due to Clostridium difficile 07/14/2003   from [facility] encounter 06/30/2019   Colon polyp    from [facility] encounter 06/30/2019   Diabetes (HCC)    Diarrhea    Dizzinesses    Essential hypertension    GERD (gastroesophageal  reflux disease)    H. pylori infection    Hx of labyrinthitis 08/14/2006   from [facility] encounter 06/30/2019   Hyperlipemia    Hypokalemia 08/04/2003   from [facility] encounter 06/30/2019   Hypokalemia 08/04/2003   Hypothyroid    Menopausal syndrome    from [facility] encounter 06/30/2019   Neck pain    from [facility] encounter 06/30/2019   Osteoporosis    Palpitations    Preoperative cardiovascular examination    SNHL (sensorineural hearing loss)    from [facility] encounter 06/30/2019   Stroke [facility])    Unsteady gait    from [facility] encounter 06/30/2019    Past Surgical History:  Procedure Laterality Date   ABDOMINAL HYSTERECTOMY     ALLOGRAFT SPINE MORSELIZED Bilateral 1226/2017   from [facility] encounter 06/30/2019, DrDavid Prior   APPENDECTOMY  CATARACT EXTRACTION     CHOLECYSTECTOMY     COLONOSCOPY     HYSTEROSCOPY WITH SALPINGOGRAM     TONSILLECTOMY     UPPER GASTROINTESTINAL ENDOSCOPY      Family History  Problem Relation Age of Onset   Osteoporosis Mother    Stroke Mother    Pancreatic cancer Mother    Hypertension Father    Epilepsy Sister    Hypertension Sister    Epilepsy Brother    Colon cancer Brother    Hypertension Brother    Liver cancer Sister    Diabetes Sister    Hypertension Sister    Healthy Sister    Hypertension Sister    Hypertension Sister    Hypertension Sister    Hypertension Sister    Cirrhosis Sister    Hepatitis C Sister    Leukemia Brother    Stomach cancer Paternal Aunt    Esophageal cancer Neg Hx    Rectal cancer Neg Hx     Social History   Socioeconomic History   Marital status: Married    Spouse name: Not on file   Number of children: Not on file   Years of education: Not on file   Highest education level: Not on file  Occupational History   Not on file  Tobacco Use   Smoking status: Never   Smokeless tobacco: Never  Vaping Use   Vaping Use: Never used  Substance and  Sexual Activity   Alcohol use: Never   Drug use: Never   Sexual activity: Yes    Partners: Male    Birth control/protection: Surgical  Other Topics Concern   Not on file  Social History Narrative   Not on file   Social Determinants of Health   Financial Resource Strain: Not on file  Food Insecurity: Not on file  Transportation Needs: Not on file  Physical Activity: Not on file  Stress: Not on file  Social Connections: Not on file  Intimate Partner Violence: Not on file      Review of systems: All other review of systems negative except as mentioned in the HPI.   Physical Exam: There were no vitals filed for this visit. There is no height or weight on file to calculate BMI. Gen:      No acute distress HEENT:  sclera anicteric Abd:      soft, non-tender; no palpable masses, no distension Ext:    No edema Neuro: alert and oriented x 3 Psych: normal mood and affect  Data Reviewed:  Reviewed labs, radiology imaging, old records and pertinent past GI work up   Assessment and Plan/Recommendations:  65 year old very pleasant female with history of endometriosis, ex lap, TAH, BSO, appendectomy and cholecystectomy with extensive intra-abdominal and pelvic adhesions with irritable bowel syndrome with predominant constipation  IBS predominant constipation Continue Linzess and increase water intake   Left lower quadrant abdominal pain associated with nausea and vomiting Will obtain 4-hour gastric emptying scan to exclude gastroparesis Discussed with patient dietary changes, avoid high-fat diet or excess fiber and to maintain blood sugar within a tight glycemic range, avoid hypoglycemic or hyperglycemic events Small meals  Nausea: Use Zofran ODT 4 mg daily as needed  If continues to have persistent symptoms, will consider a course of Xifaxan for possible small intestinal bacterial overgrowth  Family history of celiac disease: Check TTG IgA antibody  Hemochromatosis and  fatty liver: Normal LFT.  She gets intermittent phlebotomy through hematology   Intermittent abdominal cramping: Use  dicyclomine 10 mg every 8 hours as needed   GERD: Continue Protonix and antireflux measures   Return in 4 to 6 weeks  This visit required >40 minutes of patient care (this includes precharting, chart review, review of results, face-to-face time used for counseling as well as treatment plan and follow-up. The patient was provided an opportunity to ask questions and all were answered. The patient agreed with the plan and demonstrated an understanding of the instructions.  Iona BeardK. Veena Ricci Paff , MD   CC: Kathlee NationsEason, Paul, MD

## 2020-10-01 NOTE — Patient Instructions (Addendum)
You have been scheduled for a gastric emptying scan at [facility] on ____________ at __________. Please arrive at least 15 minutes prior to your appointment for registration. Please make certain not to have anything to eat or drink after midnight the night before your test. Hold all stomach medications (ex: Zofran, phenergan, Reglan) 48 hours prior to your test. If you need to reschedule your appointment, please contact radiology scheduling at [phone].  ( Patient will call and schedule this) _____________________________________________________________________ A gastric-emptying study measures how long it takes for food to move through your stomach. There are several ways to measure stomach emptying. In the most common test, you eat food that contains a small amount of radioactive material. A scanner that detects the movement of the radioactive material is placed over your abdomen to monitor the rate at which food leaves your stomach. This test normally takes about 4 hours to complete. _____________________________________________________________________  Eat small meals  AVOID high fat diets  Continue Linzess  Your provider has requested that you go to the basement level for lab work before leaving today. Press "B" on the elevator. The lab is located at the first door on the left as you exit the elevator.   Due to recent changes in healthcare laws, you may see the results of your imaging and laboratory studies on MyChart before your provider has had a chance to review them.  We understand that in some cases there may be results that are confusing or concerning to you. Not all laboratory results come back in the same time frame and the provider may be waiting for multiple results in order to interpret others.  Please give [place] 48 hours in order for your provider to thoroughly review all the results before contacting the office for clarification of your results.    I appreciate the   opportunity to care for you  Thank You   Marsa Aris , MD

## 2020-10-05 LAB — TISSUE TRANSGLUTAMINASE ABS,IGG,IGA
(tTG) Ab, IgA: <1 U/mL
(tTG) Ab, IgG: <1 U/mL

## 2020-10-05 LAB — IGA: Immunoglobulin A: 176 mg/dL (ref 70–320)

## 2020-10-14 ENCOUNTER — Encounter: Payer: Self-pay | Admitting: Internal Medicine

## 2020-10-14 ENCOUNTER — Inpatient Hospital Stay: Payer: Medicare Other | Attending: Internal Medicine | Admitting: Internal Medicine

## 2020-10-14 ENCOUNTER — Inpatient Hospital Stay: Payer: Medicare Other

## 2020-10-14 ENCOUNTER — Other Ambulatory Visit: Payer: Self-pay

## 2020-10-14 LAB — CBC WITH DIFFERENTIAL (CANCER CENTER ONLY)
Abs Immature Granulocytes: 0.03 10*3/uL (ref 0.00–0.07)
Basophils Absolute: 0.1 10*3/uL (ref 0.0–0.1)
Basophils Relative: 1 %
Eosinophils Absolute: 0.2 10*3/uL (ref 0.0–0.5)
Eosinophils Relative: 2 %
HCT: 39.9 % (ref 36.0–46.0)
Hemoglobin: 13.4 g/dL (ref 12.0–15.0)
Immature Granulocytes: 0 %
Lymphocytes Relative: 34 %
Lymphs Abs: 2.6 10*3/uL (ref 0.7–4.0)
MCH: 28.5 pg (ref 26.0–34.0)
MCHC: 33.6 g/dL (ref 30.0–36.0)
MCV: 84.7 fL (ref 80.0–100.0)
Monocytes Absolute: 0.6 10*3/uL (ref 0.1–1.0)
Monocytes Relative: 9 %
Neutro Abs: 4 10*3/uL (ref 1.7–7.7)
Neutrophils Relative %: 54 %
Platelet Count: 212 10*3/uL (ref 150–400)
RBC: 4.71 MIL/uL (ref 3.87–5.11)
RDW: 12.9 % (ref 11.5–15.5)
WBC Count: 7.5 10*3/uL (ref 4.0–10.5)
nRBC: 0 % (ref 0.0–0.2)

## 2020-10-14 LAB — IRON AND TIBC
Iron: 91 ug/dL (ref 41–142)
Saturation Ratios: 23 % (ref 21–57)
TIBC: 389 ug/dL (ref 236–444)
UIBC: 298 ug/dL (ref 120–384)

## 2020-10-14 LAB — CMP (CANCER CENTER ONLY)
ALT: 14 U/L (ref 0–44)
AST: 17 U/L (ref 15–41)
Albumin: 3.8 g/dL (ref 3.5–5.0)
Alkaline Phosphatase: 53 U/L (ref 38–126)
Anion gap: 11 (ref 5–15)
BUN: 10 mg/dL (ref 8–23)
CO2: 24 mmol/L (ref 22–32)
Calcium: 9.7 mg/dL (ref 8.9–10.3)
Chloride: 104 mmol/L (ref 98–111)
Creatinine: 0.77 mg/dL (ref 0.44–1.00)
GFR, Estimated: >60 mL/min (ref >60)
Glucose, Bld: 105 mg/dL — ABNORMAL HIGH (ref 70–99)
Potassium: 4.2 mmol/L (ref 3.5–5.1)
Sodium: 139 mmol/L (ref 135–145)
Total Bilirubin: 0.3 mg/dL (ref 0.3–1.2)
Total Protein: 7.2 g/dL (ref 6.5–8.1)

## 2020-10-14 LAB — FERRITIN: Ferritin: 55 ng/mL (ref 11–307)

## 2020-10-14 NOTE — Patient Instructions (Signed)

## 2020-10-14 NOTE — Progress Notes (Signed)
[facility] Telephone:([phone]) [phone]   Fax:([phone]) [phone]  OFFICE PROGRESS NOTE  [name], MD [address] 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  She has negative JAK2 mutation panel.  PRIOR THERAPY: None  CURRENT THERAPY: Phlebotomy on as-needed basis.  INTERVAL HISTORY: Laurie Finley 65 y.o. female returns to the clinic today for follow-up visit.  The patient is feeling fine today with no concerning complaints except for the intermittent abdominal pain and gastric upset.  She is followed by Dr. Lavon Paganini with gastroenterology and expected to have a motility study done soon.  She also continues to have generalized fatigue and shortness of breath and she is currently on home oxygen.  She denied having any chest pain, cough or hemoptysis.  She denied having any fever or chills.  She has no significant weight loss or night sweats.  She is here today for evaluation and repeat blood work and phlebotomy if needed.  MEDICAL HISTORY: Past Medical History:  Diagnosis Date   Allergic rhinitis from [facility] encounter 06/30/2019   Allergy    SEASONAL   Anxiety disorder    from [facility] encounter 06/30/2019   Arthritis of lumbosacral spine    Asthma    Asthma    from [facility] encounter 06/30/2019   Cataract    BILATERAL-REMOVED   Cervical arthritis    Chest pain    Colitis 11/25/1999   from [facility] encounter 06/30/2019   Colitis due to Clostridium difficile 07/14/2003   from [facility] encounter 06/30/2019   Colon polyp    from [facility] encounter 06/30/2019   Diabetes (HCC)    Diarrhea    Dizzinesses    Essential hypertension    GERD (gastroesophageal reflux disease)    H. pylori infection    Hx of labyrinthitis 08/14/2006   from [facility] encounter 06/30/2019   Hyperlipemia    Hypokalemia  08/04/2003   from [facility] encounter 06/30/2019   Hypokalemia 08/04/2003   Hypothyroid    Menopausal syndrome    from [facility] encounter 06/30/2019   Neck pain    from [facility] encounter 06/30/2019   Osteoporosis    Palpitations    Preoperative cardiovascular examination    SNHL (sensorineural hearing loss)    from [facility] encounter 06/30/2019   Stroke [facility])    Unsteady gait    from [facility] encounter 06/30/2019    ALLERGIES:  is allergic to eggs or egg-derived products, iodinated diagnostic agents, lisinopril, adhesive [tape], asa [aspirin], asmanex (120 metered doses) [mometasone furoate], biaxin [clarithromycin], erythromycin, evista [raloxifene hcl], influenza vaccines, nexium [esomeprazole magnesium], norco [hydrocodone-acetaminophen], oxycodone, prilosec [omeprazole], qvar [beclomethasone], tessalon [benzonatate], and topiramate.  MEDICATIONS:  Current Outpatient Medications  Medication Sig Dispense Refill   acetaminophen (TYLENOL) 500 MG tablet Take 500 mg by mouth every 6 (six) hours as needed for mild pain or moderate pain.     albuterol (VENTOLIN HFA) 108 (90 Base) MCG/ACT inhaler Inhale 1 puff into the lungs every 6 (six) hours as needed for wheezing or shortness of breath.     Albuterol Sulfate 108 (90 Base) MCG/ACT AEPB Inhale into the lungs. Nebulizer     amitriptyline (ELAVIL) 10 MG tablet Take 10 mg by mouth at bedtime.     amLODipine (NORVASC) 5 MG tablet Take 5 mg by mouth daily. Patient takes 1/2 in the morning  and 1/2 at supper.     arformoterol (BROVANA) 15 MCG/2ML NEBU Inhale 15 mcg into the lungs in the morning and at bedtime.     Butalbital-APAP-Caff-Cod 50-300-40-30 MG CAPS Take by mouth as needed.     Ca Carbonate-Mag Hydroxide (ROLAIDS PO) Take by mouth. Patient states that she uses 2-3 times weekly.     Cyanocobalamin (VITAMIN B-12 IJ) Inject as directed every 21 ( twenty-one) days.      denosumab (PROLIA) 60 MG/ML SOSY injection  Inject 60 mg into the skin every 6 (six) months.     diazepam (VALIUM) 2 MG tablet Take 2 mg by mouth. Patient takes as needed for dizziness.     dicyclomine (BENTYL) 10 MG capsule TAKE 1 CAPSULE (10 MG TOTAL) BY MOUTH EVERY 8 (EIGHT) HOURS AS NEEDED FOR SPASMS. 30 capsule 2   diphenhydrAMINE (BENADRYL) 25 MG tablet Take 25 mg by mouth at bedtime.     ergocalciferol (VITAMIN D2) 1.25 MG (50000 UT) capsule Take 50,000 Units by mouth once a week. Patient states that she takes 1 by mouth twice a week.     levothyroxine (SYNTHROID) 25 MCG tablet Take 25 mcg by mouth daily.     linaclotide (LINZESS) 72 MCG capsule Take 1 capsule (72 mcg total) by mouth daily before breakfast. 30 capsule 3   meclizine (ANTIVERT) 12.5 MG tablet Take 12.5 mg by mouth 3 (three) times daily as needed for dizziness.     metFORMIN (GLUCOPHAGE-XR) 500 MG 24 hr tablet Take 2 tablets by mouth in the morning and at bedtime.     METOPROLOL TARTRATE PO Take by mouth. 12.5 mg in the morning and 12.5 mg in at night.     OXYGEN Inhale 2 L into the lungs continuous. 3 L at night     pantoprazole (PROTONIX) 40 MG tablet TAKE 1 TABLET BY MOUTH EVERY DAY 90 tablet 3   pravastatin (PRAVACHOL) 10 MG tablet Take 10 mg by mouth daily.     Wheat Dextrin (BENEFIBER ON THE GO PO) Take 1 packet by mouth. 3/3.5 gram packet daily from [facility] encounter 06/30/2019     No current facility-administered medications for this visit.    SURGICAL HISTORY:  Past Surgical History:  Procedure Laterality Date   ABDOMINAL HYSTERECTOMY     ALLOGRAFT SPINE MORSELIZED Bilateral 1226/2017   from [facility] encounter 06/30/2019, DrDavid Prior   APPENDECTOMY     CATARACT EXTRACTION     CHOLECYSTECTOMY     COLONOSCOPY     HYSTEROSCOPY WITH SALPINGOGRAM     TONSILLECTOMY     UPPER GASTROINTESTINAL ENDOSCOPY      REVIEW OF SYSTEMS:  A comprehensive review of systems was negative except for: Constitutional: positive for fatigue Respiratory:  positive for dyspnea on exertion Gastrointestinal: positive for abdominal pain and dyspepsia   PHYSICAL EXAMINATION: General appearance: alert, cooperative, fatigued, and no distress Head: Normocephalic, without obvious abnormality, atraumatic Neck: no adenopathy, no JVD, supple, symmetrical, trachea midline, and thyroid not enlarged, symmetric, no tenderness/mass/nodules Lymph nodes: Cervical, supraclavicular, and axillary nodes normal. Resp: clear to auscultation bilaterally Back: symmetric, no curvature. ROM normal. No CVA tenderness. Cardio: regular rate and rhythm, S1, S2 normal, no murmur, click, rub or gallop GI: soft, non-tender; bowel sounds normal; no masses,  no organomegaly Extremities: extremities normal, atraumatic, no cyanosis or edema  ECOG PERFORMANCE STATUS: 1 - Symptomatic but completely ambulatory  Blood pressure 130/72, pulse 75, temperature (!) 97.2 F (36.2 C), temperature source Tympanic, resp. rate 20,  height 5' 2.75" (1.594 m), weight 213 lb 9.6 oz (96.9 kg), SpO2 98 %.  LABORATORY DATA: Lab Results  Component Value Date   WBC 7.5 10/14/2020   HGB 13.4 10/14/2020   HCT 39.9 10/14/2020   MCV 84.7 10/14/2020   PLT 212 10/14/2020      Chemistry      Component Value Date/Time   NA 140 06/14/2020 1140   K 4.0 06/14/2020 1140   CL 102 06/14/2020 1140   CO2 25 06/14/2020 1140   BUN 9 06/14/2020 1140   CREATININE 0.89 06/14/2020 1140   CREATININE 0.80 01/02/2019 1329      Component Value Date/Time   CALCIUM 10.0 06/14/2020 1140   ALKPHOS 59 06/14/2020 1140   AST 18 06/14/2020 1140   ALT 18 06/14/2020 1140   BILITOT 0.4 06/14/2020 1140       RADIOGRAPHIC STUDIES: No results found.   ASSESSMENT AND PLAN: This is a very pleasant 65 years old white female recently diagnosed with hemochromatosis with DNA mutation and C282Y and H63D.  The patient continues to complain of increasing fatigue and weakness as well as intermittent abdominal pain and  nausea. Her Jak 2 mutation panel was unremarkable. The patient is currently on phlebotomy to keep her ferritin level less than 50. CBC today is unremarkable but ferritin level is 55. We will proceed with phlebotomy today to keep her ferritin level less than 50.  I will see the patient back for follow-up visit in 6 months for evaluation and repeat CBC, comprehensive metabolic panel, iron study and ferritin and consideration of phlebotomy if needed. The patient agreed to the current plan. She was advised to call immediately if she has any other concerning symptoms in the interval. The patient voices understanding of current disease status and treatment options and is in agreement with the current care plan.  All questions were answered. The patient knows to call the clinic with any problems, questions or concerns. We can certainly see the patient much sooner if necessary.  Disclaimer: This note was dictated with voice recognition software. Similar sounding words can inadvertently be transcribed and may not be corrected upon review.

## 2020-10-15 ENCOUNTER — Telehealth: Payer: Self-pay | Admitting: Internal Medicine

## 2020-10-15 NOTE — Telephone Encounter (Signed)
Scheduled per los. Called and left msg. Mailed printout  °

## 2020-10-19 ENCOUNTER — Telehealth: Payer: Self-pay | Admitting: Gastroenterology

## 2020-10-19 NOTE — Telephone Encounter (Signed)
Inbound call from patient requesting call please in regards to what medications she needs to stop taking for her upcoming procedure.

## 2020-10-20 NOTE — Telephone Encounter (Signed)
Returned patients call, I told her to hold her stomach medications 48 hours prior to her Gastric Emptying Scan. And she could take her Linzess and Meclizine was fine

## 2020-10-22 ENCOUNTER — Encounter (HOSPITAL_COMMUNITY)
Admission: RE | Admit: 2020-10-22 | Discharge: 2020-10-22 | Disposition: A | Discharge status: Home / Self Care | Payer: Medicare Other | Source: Ambulatory Visit | Attending: Gastroenterology | Admitting: Gastroenterology

## 2020-10-22 ENCOUNTER — Other Ambulatory Visit: Payer: Self-pay

## 2020-10-22 DIAGNOSIS — R1012 Left upper quadrant pain: Secondary | ICD-10-CM | POA: Diagnosis present

## 2020-10-22 DIAGNOSIS — R110 Nausea: Secondary | ICD-10-CM | POA: Insufficient documentation

## 2020-10-22 DIAGNOSIS — Z8379 Family history of other diseases of the digestive system: Secondary | ICD-10-CM | POA: Diagnosis present

## 2020-10-22 MED ORDER — TECHNETIUM TC 99M SULFUR COLLOID
2.2000 | Freq: Once | INTRAVENOUS | Status: AC | PRN
  Administered 2020-10-22: 2.2 via ORAL

## 2020-11-05 ENCOUNTER — Ambulatory Visit (INDEPENDENT_AMBULATORY_CARE_PROVIDER_SITE_OTHER): Payer: Medicare Other | Admitting: Gastroenterology

## 2020-11-05 ENCOUNTER — Encounter: Payer: Self-pay | Admitting: Gastroenterology

## 2020-11-05 VITALS — BP 132/70 | HR 64 | Ht 62.75 in | Wt 210.0 lb

## 2020-11-05 DIAGNOSIS — R112 Nausea with vomiting, unspecified: Secondary | ICD-10-CM | POA: Diagnosis not present

## 2020-11-05 DIAGNOSIS — K589 Irritable bowel syndrome without diarrhea: Secondary | ICD-10-CM

## 2020-11-05 DIAGNOSIS — R109 Unspecified abdominal pain: Secondary | ICD-10-CM

## 2020-11-05 NOTE — Patient Instructions (Addendum)
Continue small frequent meals with high protein diet  AVOID sugar or high carbs   Follow up in 4 months, you will need to call back to schedule   If you are age 66 or older, your body mass index should be between 23-30. Your Body mass index is 37.5 kg/m. If this is out of the aforementioned range listed, please consider follow up with your Primary Care Provider.  If you are age 26 or younger, your body mass index should be between 19-25. Your Body mass index is 37.5 kg/m. If this is out of the aformentioned range listed, please consider follow up with your Primary Care Provider.   __________________________________________________________  The [place] [facility] providers would like to encourage you to use [facility] to communicate with providers for non-urgent requests or questions.  Due to long hold times on the telephone, sending your provider a message by [facility] may be a faster and more efficient way to get a response.  Please allow 48 business hours for a response.  Please remember that this is for non-urgent requests.    I appreciate the  opportunity to care for you  Thank You   Marsa Aris , MD

## 2020-11-05 NOTE — Progress Notes (Signed)
Laurie PughDonna Finley    [id number]    November 08, 1955  Primary Care Physician:Eason, Renae FicklePaul, MD  Referring Physician: Kathlee NationsEason, Paul, MD [id number] [place],  [place] [id number]   Chief complaint:  Nausea and vomiting  HPI:  65 year old very pleasant female here for follow-up visit with complaints of nausea, vomiting and left lower quadrant abdominal pain  Gastric emptying study 10/23/20 Expected location of the stomach in the left upper quadrant. Ingested meal empties the stomach gradually over the course of the study with 11.24% retention at 60 min and 2.55% retention at 120 min (normal retention less than 30% at a 120 min).  TTG Ig Ab negative for celiac disease  Her symptoms are somewhat better since she started eating small frequent meals.  She is no longer vomiting.  She continues to have intermittent abdominal discomfort and cramping which improves when she takes dicyclomine.  She is taking Linzess 72 mcg daily with bowel movement on average 3-4 times a week.  She tried higher strength Linzess, experienced worsening abdominal cramping.  She is no longer using fiber supplements.     Diarrhea improved after she completed course of Xifaxan.     History of hemochromatosis, gets periodic phlebotomy for iron chelation.   Her niece and nephew recently got tested positive for celiac disease and she is worried if she has it.   CT abdomen and pelvis 08/15/2019 No CT findings to account for the patient's chronic abdominal pain. Status post cholecystectomy, appendectomy, and hysterectomy.   Colonoscopy October 03, 2019: For diarrhea evaluation, random colon biopsies negative for microscopic colitis.  Diverticulosis and internal hemorrhoid EGD October 03, 2019: Mild erosive esophagitis secondary to reflux, hiatal hernia, CLOtest negative for H. pylori otherwise normal exam   Outpatient Encounter Medications as of 11/05/2020  Medication Sig   acetaminophen (TYLENOL) 500 MG tablet Take  500 mg by mouth every 6 (six) hours as needed for mild pain or moderate pain.   albuterol (VENTOLIN HFA) 108 (90 Base) MCG/ACT inhaler Inhale 1 puff into the lungs every 6 (six) hours as needed for wheezing or shortness of breath.   Albuterol Sulfate 108 (90 Base) MCG/ACT AEPB Inhale into the lungs. Nebulizer   amitriptyline (ELAVIL) 10 MG tablet Take 10 mg by mouth at bedtime.   amLODipine (NORVASC) 5 MG tablet Take 5 mg by mouth daily. Patient takes 1/2 in the morning and 1/2 at supper.   arformoterol (BROVANA) 15 MCG/2ML NEBU Inhale 15 mcg into the lungs in the morning and at bedtime.   Butalbital-APAP-Caff-Cod 50-300-40-30 MG CAPS Take by mouth as needed.   Ca Carbonate-Mag Hydroxide (ROLAIDS PO) Take by mouth. Patient states that she uses 2-3 times weekly.   Cyanocobalamin (VITAMIN B-12 IJ) Inject as directed every 21 ( twenty-one) days.    denosumab (PROLIA) 60 MG/ML SOSY injection Inject 60 mg into the skin every 6 (six) months.   diazepam (VALIUM) 2 MG tablet Take 2 mg by mouth. Patient takes as needed for dizziness.   dicyclomine (BENTYL) 10 MG capsule TAKE 1 CAPSULE (10 MG TOTAL) BY MOUTH EVERY 8 (EIGHT) HOURS AS NEEDED FOR SPASMS.   diphenhydrAMINE (BENADRYL) 25 MG tablet Take 25 mg by mouth at bedtime.   ergocalciferol (VITAMIN D2) 1.25 MG (50000 UT) capsule Take 50,000 Units by mouth once a week. Patient states that she takes 1 by mouth twice a week.   levothyroxine (SYNTHROID) 25 MCG tablet Take 25 mcg by mouth daily.  linaclotide (LINZESS) 72 MCG capsule Take 1 capsule (72 mcg total) by mouth daily before breakfast.   meclizine (ANTIVERT) 12.5 MG tablet Take 12.5 mg by mouth 3 (three) times daily as needed for dizziness.   metFORMIN (GLUCOPHAGE-XR) 500 MG 24 hr tablet Take 2 tablets by mouth in the morning and at bedtime.   METOPROLOL TARTRATE PO Take by mouth. 12.5 mg in the morning and 12.5 mg in at night.   OXYGEN Inhale 2 L into the lungs continuous. 3 L at night    pantoprazole (PROTONIX) 40 MG tablet TAKE 1 TABLET BY MOUTH EVERY DAY   pravastatin (PRAVACHOL) 10 MG tablet Take 10 mg by mouth daily.   Wheat Dextrin (BENEFIBER ON THE GO PO) Take 1 packet by mouth. 3/3.5 gram packet daily from [facility] encounter 06/30/2019   No facility-administered encounter medications on file as of 11/05/2020.    Allergies as of 11/05/2020 - Review Complete 11/05/2020  Allergen Reaction Noted   Eggs or egg-derived products Anaphylaxis and Other (See Comments) 02/08/2009   Iodinated diagnostic agents Hives 01/02/2019   Lisinopril  01/28/2015   Adhesive [tape]  01/02/2019   Asa [aspirin]  01/02/2019   Asmanex (120 metered doses) [mometasone furoate]  01/02/2019   Biaxin [clarithromycin]  01/02/2019   Erythromycin  01/02/2019   Evista [raloxifene hcl]  01/02/2019   Influenza vaccines  01/02/2019   Nexium [esomeprazole magnesium]  01/02/2019   Norco [hydrocodone-acetaminophen]  01/02/2019   Oxycodone  01/02/2019   Prilosec [omeprazole]  01/02/2019   Qvar [beclomethasone]  01/02/2019   Tessalon [benzonatate]  01/02/2019   Topiramate Itching 06/19/2018    Past Medical History:  Diagnosis Date   Allergic rhinitis from [facility] encounter 06/30/2019   Allergy    SEASONAL   Anxiety disorder    from [facility] encounter 06/30/2019   Arthritis of lumbosacral spine    Asthma    Asthma    from [facility] encounter 06/30/2019   Cataract    BILATERAL-REMOVED   Cervical arthritis    Chest pain    Colitis 11/25/1999   from [facility] encounter 06/30/2019   Colitis due to Clostridium difficile 07/14/2003   from [facility] encounter 06/30/2019   Colon polyp    from [facility] encounter 06/30/2019   Diabetes (HCC)    Diarrhea    Dizzinesses    Essential hypertension    GERD (gastroesophageal reflux disease)    H. pylori infection    Hx of labyrinthitis 08/14/2006   from [facility] encounter 06/30/2019   Hyperlipemia     Hypokalemia 08/04/2003   from [facility] encounter 06/30/2019   Hypokalemia 08/04/2003   Hypothyroid    Menopausal syndrome    from [facility] encounter 06/30/2019   Neck pain    from [facility] encounter 06/30/2019   Osteoporosis    Palpitations    Preoperative cardiovascular examination    SNHL (sensorineural hearing loss)    from [facility] encounter 06/30/2019   Stroke [facility])    Unsteady gait    from [facility] encounter 06/30/2019    Past Surgical History:  Procedure Laterality Date   ABDOMINAL HYSTERECTOMY     ALLOGRAFT SPINE MORSELIZED Bilateral 1226/2017   from [facility] encounter 06/30/2019, DrDavid Prior   APPENDECTOMY     CATARACT EXTRACTION Bilateral    CHOLECYSTECTOMY     COLONOSCOPY     LAPAROTOMY     with mesh (per pt, bowel tissue adhered due to endometriosis)  TONSILLECTOMY     UPPER GASTROINTESTINAL ENDOSCOPY      Family History  Problem Relation Age of Onset   Osteoporosis Mother    Stroke Mother    Pancreatic cancer Mother    Hypertension Father    Epilepsy Sister    Hypertension Sister    Liver cancer Sister    Diabetes Sister    Hypertension Sister    Healthy Sister    Hypertension Sister    Hypertension Sister    Hypertension Sister    Lung disease Sister    Hypertension Sister    Cirrhosis Sister    Hepatitis C Sister    Epilepsy Brother    Colon cancer Brother    Hypertension Brother    Other Brother        Brain bleed   Leukemia Brother    Stomach cancer Paternal Aunt    Celiac disease Nephew    Celiac disease Niece    Esophageal cancer Neg Hx    Rectal cancer Neg Hx     Social History   Socioeconomic History   Marital status: Married    Spouse name: Not on file   Number of children: Not on file   Years of education: Not on file   Highest education level: Not on file  Occupational History   Not on file  Tobacco Use   Smoking status: Never   Smokeless tobacco: Never  Vaping Use   Vaping Use:  Never used  Substance and Sexual Activity   Alcohol use: Never   Drug use: Never   Sexual activity: Yes    Partners: Male    Birth control/protection: Surgical  Other Topics Concern   Not on file  Social History Narrative   Not on file   Social Determinants of Health   Financial Resource Strain: Not on file  Food Insecurity: Not on file  Transportation Needs: Not on file  Physical Activity: Not on file  Stress: Not on file  Social Connections: Not on file  Intimate Partner Violence: Not on file      Review of systems: All other review of systems negative except as mentioned in the HPI.   Physical Exam: Vitals:   11/05/20 1119  BP: 132/70  Pulse: 64   Body mass index is 37.5 kg/m. Gen:      No acute distress HEENT:  sclera anicteric Abd:      soft, non-tender; no palpable masses, no distension Ext:    No edema Neuro: alert and oriented x 3 Psych: normal mood and affect  Data Reviewed:  Reviewed labs, radiology imaging, old records and pertinent past GI work up   Assessment and Plan/Recommendations:  65 year old very pleasant female with history of endometriosis, ex lap, TAH, BSO, appendectomy and cholecystectomy with extensive intra-abdominal and pelvic adhesions with irritable bowel syndrome with predominant constipation  Nausea and vomiting: Multifactorial, rapid gastric emptying likely contributing to her symptoms Advised patient to eat small frequent meals with high-protein diet Avoid sugar or high carbs Use Zofran ODT 4 mg daily as needed for severe symptoms   IBS predominant constipation Continue Linzess and increase water intake Add MiraLAX 1 capful every other day and titrate based on response   Hemochromatosis and fatty liver: Normal LFT.  She gets intermittent phlebotomy through hematology   Intermittent abdominal cramping: Use dicyclomine 10 mg every 8 hours as needed   GERD: Continue Protonix and antireflux measures   Return in 3 to  4 months or sooner  if needed This visit required >40 minutes of patient care (this includes precharting, chart review, review of results, face-to-face time used for counseling as well as treatment plan and follow-up. The patient was provided an opportunity to ask questions and all were answered. The patient agreed with the plan and demonstrated an understanding of the instructions.  Iona Beard , MD    CC: [name], MD

## 2020-12-31 ENCOUNTER — Telehealth: Payer: Self-pay | Admitting: Gastroenterology

## 2020-12-31 NOTE — Telephone Encounter (Signed)
Inbound call from pt requesting a refill for Linzess and for it to go to [facility]. Best contact [phone]. Please advise. Thank you.

## 2021-01-03 MED ORDER — LINACLOTIDE 72 MCG PO CAPS: 72.0000 ug | ORAL_CAPSULE | Freq: Every day | ORAL | 1 refills | Status: DC

## 2021-01-03 NOTE — Telephone Encounter (Signed)
Inbound call from patient. States she spoke with [employer] and they says she have one more Linzess refill that can be fulfilled. States she was to get 4 and only received 3. Asked if a prescription be faxed to [phone] to be fulfilled.   Says she still needs the other papers as well.

## 2021-01-03 NOTE — Telephone Encounter (Signed)
Received patient assistance form will start filing out today

## 2021-01-03 NOTE — Telephone Encounter (Signed)
This is not a mail order pharmacy she is talking about, This is [employer] patient assistance program number is [phone]   They are saying enrollment has expired. Pt will need to fill out new form, called patient to inform. Asked [employer] to fax over new form for [place] to start the process.

## 2021-01-03 NOTE — Telephone Encounter (Signed)
Printed new rx with a Linzess refill and faxed to number provided by patient

## 2021-01-03 NOTE — Addendum Note (Signed)
Addended by: Marlowe Kays on: 01/03/2021 12:24 PM   Modules accepted: Orders

## 2021-01-31 NOTE — Telephone Encounter (Signed)
Called patient to inform that I refaxed over the page with her prescription and Dr Sharol Given information to [employer] patient assistance

## 2021-01-31 NOTE — Telephone Encounter (Signed)
Inbound call from patient. States that [employer] needs page 2 of the application needs to be filled out and faxed for her refill.

## 2021-02-14 NOTE — Telephone Encounter (Signed)
[employer] told her we need to include the quantity and the directions so they can process her Linzess application. She can reached at [phone] for any questions.

## 2021-02-16 NOTE — Telephone Encounter (Signed)
Done, sent in info they needed today by fax

## 2021-02-18 ENCOUNTER — Ambulatory Visit: Payer: Medicare Other | Admitting: Gastroenterology

## 2021-04-19 ENCOUNTER — Inpatient Hospital Stay: Payer: Medicare Other | Attending: Internal Medicine | Admitting: Internal Medicine

## 2021-04-19 ENCOUNTER — Other Ambulatory Visit: Payer: Self-pay

## 2021-04-19 ENCOUNTER — Inpatient Hospital Stay: Payer: Medicare Other

## 2021-04-19 LAB — CBC WITH DIFFERENTIAL (CANCER CENTER ONLY)
Abs Immature Granulocytes: 0.03 10*3/uL (ref 0.00–0.07)
Basophils Absolute: 0.1 10*3/uL (ref 0.0–0.1)
Basophils Relative: 1 %
Eosinophils Absolute: 0.1 10*3/uL (ref 0.0–0.5)
Eosinophils Relative: 1 %
HCT: 40 % (ref 36.0–46.0)
Hemoglobin: 13.4 g/dL (ref 12.0–15.0)
Immature Granulocytes: 0 %
Lymphocytes Relative: 27 %
Lymphs Abs: 2.7 10*3/uL (ref 0.7–4.0)
MCH: 29.1 pg (ref 26.0–34.0)
MCHC: 33.5 g/dL (ref 30.0–36.0)
MCV: 86.8 fL (ref 80.0–100.0)
Monocytes Absolute: 0.7 10*3/uL (ref 0.1–1.0)
Monocytes Relative: 7 %
Neutro Abs: 6.2 10*3/uL (ref 1.7–7.7)
Neutrophils Relative %: 64 %
Platelet Count: 245 10*3/uL (ref 150–400)
RBC: 4.61 MIL/uL (ref 3.87–5.11)
RDW: 13.4 % (ref 11.5–15.5)
WBC Count: 9.8 10*3/uL (ref 4.0–10.5)
nRBC: 0 % (ref 0.0–0.2)

## 2021-04-19 LAB — CMP (CANCER CENTER ONLY)
ALT: 16 U/L (ref 0–44)
AST: 16 U/L (ref 15–41)
Albumin: 4.6 g/dL (ref 3.5–5.0)
Alkaline Phosphatase: 61 U/L (ref 38–126)
Anion gap: 11 (ref 5–15)
BUN: 15 mg/dL (ref 8–23)
CO2: 24 mmol/L (ref 22–32)
Calcium: 10.2 mg/dL (ref 8.9–10.3)
Chloride: 98 mmol/L (ref 98–111)
Creatinine: 0.86 mg/dL (ref 0.44–1.00)
GFR, Estimated: >60 mL/min (ref >60)
Glucose, Bld: 144 mg/dL — ABNORMAL HIGH (ref 70–99)
Potassium: 4 mmol/L (ref 3.5–5.1)
Sodium: 133 mmol/L — ABNORMAL LOW (ref 135–145)
Total Bilirubin: 0.3 mg/dL (ref 0.3–1.2)
Total Protein: 7.9 g/dL (ref 6.5–8.1)

## 2021-04-19 LAB — IRON AND IRON BINDING CAPACITY (CC-WL,HP ONLY)
Iron: 108 ug/dL (ref 28–170)
Saturation Ratios: 24 % (ref 10.4–31.8)
TIBC: 458 ug/dL — ABNORMAL HIGH (ref 250–450)
UIBC: 350 ug/dL (ref 148–442)

## 2021-04-19 LAB — FERRITIN: Ferritin: 58 ng/mL (ref 11–307)

## 2021-04-19 NOTE — Progress Notes (Signed)
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  She has negative JAK2 mutation panel.  PRIOR THERAPY: None  CURRENT THERAPY: Phlebotomy on as-needed basis.  INTERVAL HISTORY: Laurie Finley 66 y.o. female returns to the clinic today for follow-up visit accompanied by her husband The patient is complaining of increasing fatigue and weakness as well as the baseline shortness of breath and she is currently on home oxygen.  She used to receive B12 injection by her primary care physician every 3 weeks and she missed it by 1 day and she is feeling worse.  She has no chest pain, cough or hemoptysis.  She denied having any recent weight loss or night sweats.  She has no headache or visual changes.  She is here today for evaluation and repeat blood work and phlebotomy if needed.  MEDICAL HISTORY: Past Medical History:  Diagnosis Date   Allergic rhinitis from [facility] encounter 06/30/2019   Allergy    SEASONAL   Anxiety disorder    from [facility] encounter 06/30/2019   Arthritis of lumbosacral spine    Asthma    Asthma    from [facility] encounter 06/30/2019   Cataract    BILATERAL-REMOVED   Cervical arthritis    Chest pain    Colitis 11/25/1999   from [facility] clinic encounter 06/30/2019   Colitis due to Clostridium difficile 07/14/2003   from [facility] clinic encounter 06/30/2019   Colon polyp    from [facility] encounter 06/30/2019   Diabetes ([facility])    Diarrhea    Dizzinesses    Essential hypertension    GERD (gastroesophageal reflux disease)    H. pylori infection    Hx of labyrinthitis 08/14/2006   from [place] clinic encounter 06/30/2019   Hyperlipemia    Hypokalemia 08/04/2003   from [facility] clinic encounter  06/30/2019   Hypokalemia 08/04/2003   Hypothyroid    Menopausal syndrome    from [facility] clinic encounter 06/30/2019   Neck pain    from [facility] clinic encounter 06/30/2019   Osteoporosis    Palpitations    Preoperative cardiovascular examination    SNHL (sensorineural hearing loss)    from [facility] clinic encounter 06/30/2019   Stroke [facility])    Unsteady gait    from [facility] encounter 06/30/2019    ALLERGIES:  is allergic to eggs or egg-derived products, iodinated contrast media, lisinopril, adhesive [tape], asa [aspirin], asmanex (120 metered doses) [mometasone furoate], biaxin [clarithromycin], erythromycin, evista [raloxifene hcl], influenza vaccines, nexium [esomeprazole magnesium], norco [hydrocodone-acetaminophen], oxycodone, prilosec [omeprazole], qvar [beclomethasone], tessalon [benzonatate], and topiramate.  MEDICATIONS:  Current Outpatient Medications  Medication Sig Dispense Refill   acetaminophen (TYLENOL) 500 MG tablet Take 500 mg by mouth every 6 (six) hours as needed for mild pain or moderate pain.     albuterol (VENTOLIN HFA) 108 (90 Base) MCG/ACT inhaler Inhale 1 puff into the lungs every 6 (six) hours as needed for wheezing or shortness of breath.     Albuterol Sulfate 108 (90 Base) MCG/ACT AEPB Inhale into the lungs. Nebulizer     amitriptyline (ELAVIL) 10 MG tablet Take 10 mg by mouth at bedtime.     amLODipine (NORVASC) 5 MG tablet Take 5 mg by mouth daily. Patient takes 1/2 in the morning and 1/2  at supper.     arformoterol (BROVANA) 15 MCG/2ML NEBU Inhale 15 mcg into the lungs in the morning and at bedtime.     Butalbital-APAP-Caff-Cod 50-300-40-30 MG CAPS Take by mouth as needed.     Ca Carbonate-Mag Hydroxide (ROLAIDS PO) Take by mouth. Patient states that she uses 2-3 times weekly.     Cyanocobalamin (VITAMIN B-12 IJ) Inject as directed every 21 ( twenty-one) days.      denosumab (PROLIA) 60 MG/ML SOSY injection Inject 60 mg into the skin every 6 (six) months.      diazepam (VALIUM) 2 MG tablet Take 2 mg by mouth. Patient takes as needed for dizziness.     dicyclomine (BENTYL) 10 MG capsule TAKE 1 CAPSULE (10 MG TOTAL) BY MOUTH EVERY 8 (EIGHT) HOURS AS NEEDED FOR SPASMS. 30 capsule 2   diphenhydrAMINE (BENADRYL) 25 MG tablet Take 25 mg by mouth at bedtime.     ergocalciferol (VITAMIN D2) 1.25 MG (50000 UT) capsule Take 50,000 Units by mouth once a week. Patient states that she takes 1 by mouth twice a week.     levothyroxine (SYNTHROID) 25 MCG tablet Take 25 mcg by mouth daily.     linaclotide (LINZESS) 72 MCG capsule Take 1 capsule (72 mcg total) by mouth daily before breakfast. 30 capsule 1   meclizine (ANTIVERT) 12.5 MG tablet Take 12.5 mg by mouth 3 (three) times daily as needed for dizziness.     metFORMIN (GLUCOPHAGE-XR) 500 MG 24 hr tablet Take 2 tablets by mouth in the morning and at bedtime.     METOPROLOL TARTRATE PO Take by mouth. 12.5 mg in the morning and 12.5 mg in at night.     OXYGEN Inhale 2 L into the lungs continuous. 3 L at night     pantoprazole (PROTONIX) 40 MG tablet TAKE 1 TABLET BY MOUTH EVERY DAY 90 tablet 3   pravastatin (PRAVACHOL) 10 MG tablet Take 10 mg by mouth daily.     Wheat Dextrin (BENEFIBER ON THE GO PO) Take 1 packet by mouth. 3/3.5 gram packet daily from [facility] clinic encounter 06/30/2019     No current facility-administered medications for this visit.    SURGICAL HISTORY:  Past Surgical History:  Procedure Laterality Date   ABDOMINAL HYSTERECTOMY     ALLOGRAFT SPINE MORSELIZED Bilateral 1226/2017   from [place] clinic encounter 06/30/2019, DrDavid Prior   APPENDECTOMY     CATARACT EXTRACTION Bilateral    CHOLECYSTECTOMY     COLONOSCOPY     LAPAROTOMY     with mesh (per pt, bowel tissue adhered due to endometriosis)   TONSILLECTOMY     UPPER GASTROINTESTINAL ENDOSCOPY      REVIEW OF SYSTEMS:  A comprehensive review of systems was negative except for: Constitutional: positive for fatigue Respiratory:  positive for dyspnea on exertion   PHYSICAL EXAMINATION: General appearance: alert, cooperative, fatigued, and no distress Head: Normocephalic, without obvious abnormality, atraumatic Neck: no adenopathy, no JVD, supple, symmetrical, trachea midline, and thyroid not enlarged, symmetric, no tenderness/mass/nodules Lymph nodes: Cervical, supraclavicular, and axillary nodes normal. Resp: clear to auscultation bilaterally Back: symmetric, no curvature. ROM normal. No CVA tenderness. Cardio: regular rate and rhythm, S1, S2 normal, no murmur, click, rub or gallop GI: soft, non-tender; bowel sounds normal; no masses,  no organomegaly Extremities: extremities normal, atraumatic, no cyanosis or edema  ECOG PERFORMANCE STATUS: 1 - Symptomatic but completely ambulatory  Blood pressure 129/68, pulse 81, temperature 98.6 F (37 C), temperature source Tympanic, resp. rate 17,  weight 207 lb 4.8 oz (94 kg), SpO2 100 %.  LABORATORY DATA: Lab Results  Component Value Date   WBC 9.8 04/19/2021   HGB 13.4 04/19/2021   HCT 40.0 04/19/2021   MCV 86.8 04/19/2021   PLT 245 04/19/2021      Chemistry      Component Value Date/Time   NA 133 (L) 04/19/2021 1455   K 4.0 04/19/2021 1455   CL 98 04/19/2021 1455   CO2 24 04/19/2021 1455   BUN 15 04/19/2021 1455   CREATININE 0.86 04/19/2021 1455   CREATININE 0.80 01/02/2019 1329      Component Value Date/Time   CALCIUM 10.2 04/19/2021 1455   ALKPHOS 61 04/19/2021 1455   AST 16 04/19/2021 1455   ALT 16 04/19/2021 1455   BILITOT 0.3 04/19/2021 1455       RADIOGRAPHIC STUDIES: No results found.   ASSESSMENT AND PLAN: This is a very pleasant 66 years old white female recently diagnosed with hemochromatosis with DNA mutation and C282Y and H63D.  The patient continues to complain of increasing fatigue and weakness as well as intermittent abdominal pain and nausea. Her Jak 2 mutation panel was unremarkable. The patient is currently on phlebotomy to  keep her ferritin level less than 50. Repeat ferritin level today was 58. I recommended for the patient to proceed with phlebotomy today as planned. We will see her back for follow-up visit in 6 months for evaluation and repeat blood work. She was advised to call immediately if she has any other concerning symptoms in the interval. The patient voices understanding of current disease status and treatment options and is in agreement with the current care plan.  All questions were answered. The patient knows to call the clinic with any problems, questions or concerns. We can certainly see the patient much sooner if necessary.  Disclaimer: This note was dictated with voice recognition software. Similar sounding words can inadvertently be transcribed and may not be corrected upon review.

## 2021-04-19 NOTE — Patient Instructions (Signed)

## 2021-04-19 NOTE — Progress Notes (Signed)
Therapeutic phlebotomy performed today per MD orders. Started at 1652 and ended at 91 with a total of 508g removed. A 15 G phlebotomy kit was used on the right AC. IV needle removed and intact, patient tolerated procedure well. Fluids given, declined anything to eat. Patient observed for 30 minutes after procedure without incident.

## 2021-04-26 ENCOUNTER — Other Ambulatory Visit: Payer: Medicare Other

## 2021-04-26 ENCOUNTER — Ambulatory Visit (INDEPENDENT_AMBULATORY_CARE_PROVIDER_SITE_OTHER): Payer: Medicare Other | Admitting: Gastroenterology

## 2021-04-26 ENCOUNTER — Encounter: Payer: Self-pay | Admitting: Gastroenterology

## 2021-04-26 VITALS — BP 120/70 | HR 69 | Ht 62.0 in | Wt 208.0 lb

## 2021-04-26 DIAGNOSIS — K581 Irritable bowel syndrome with constipation: Secondary | ICD-10-CM

## 2021-04-26 DIAGNOSIS — R109 Unspecified abdominal pain: Secondary | ICD-10-CM | POA: Diagnosis not present

## 2021-04-26 DIAGNOSIS — R110 Nausea: Secondary | ICD-10-CM

## 2021-04-26 NOTE — Patient Instructions (Signed)
We will send Linzess refills to your pharmacy  Continue Miralax  Follow up in 3 months  If you are age 67 or older, your body mass index should be between 23-30. Your Body mass index is 38.04 kg/m. If this is out of the aforementioned range listed, please consider follow up with your Primary Care Provider.  If you are age 41 or younger, your body mass index should be between 19-25. Your Body mass index is 38.04 kg/m. If this is out of the aformentioned range listed, please consider follow up with your Primary Care Provider.   ________________________________________________________  The [facility] providers would like to encourage you to use [facility] to communicate with providers for non-urgent requests or questions.  Due to long hold times on the telephone, sending your provider a message by [facility] may be a faster and more efficient way to get a response.  Please allow 48 business hours for a response.  Please remember that this is for non-urgent requests.  _______________________________________________________   I appreciate the  opportunity to care for you  Thank You   Marsa Aris , MD

## 2021-04-26 NOTE — Progress Notes (Signed)
Laurie Finley    VN:[id number]    Oct 23, 1955  Primary Care Physician:Eason, Eddie Dibbles, MD  Referring Physician: [name], [place] [address]   Chief complaint:  IBS constipation, nausea, abdominal pain  HPI:  66 year old very pleasant female here for follow-up visit for generalized abdominal pain, constipation and intermittent nausea    Her symptoms are somewhat better since she started eating small frequent meals with high-protein diet.  She is no longer vomiting and does not have nausea every day, uses meclizine as needed for intermittent episodes of nausea.  She continues to have intermittent abdominal discomfort and cramping which improves when she takes dicyclomine.   She is taking Linzess 72 mcg daily alternating with MiraLAX every other day.  On average she is having daily bowel movement    Diarrhea improved after she completed course of Xifaxan.  Gastric emptying study 10/23/20 Expected location of the stomach in the left upper quadrant. Ingested meal empties the stomach gradually over the course of the study with 11.24% retention at 60 min and 2.55% retention at 120 min (normal retention less than 30% at a 120 min).   TTG Ig Ab negative for celiac disease   History of hemochromatosis, gets periodic phlebotomy for iron chelation.   CT abdomen and pelvis 08/15/2019 No CT findings to account for the patient's chronic abdominal pain. Status post cholecystectomy, appendectomy, and hysterectomy.   Colonoscopy October 03, 2019: For diarrhea evaluation, random colon biopsies negative for microscopic colitis.  Diverticulosis and internal hemorrhoid EGD October 03, 2019: Mild erosive esophagitis secondary to reflux, hiatal hernia, CLOtest negative for H. pylori otherwise normal exam     Outpatient Encounter Medications as of 04/26/2021  Medication Sig   acetaminophen (TYLENOL) 500 MG tablet Take 500 mg by mouth every 6 (six) hours as needed for  mild pain or moderate pain.   albuterol (VENTOLIN HFA) 108 (90 Base) MCG/ACT inhaler Inhale 1 puff into the lungs every 6 (six) hours as needed for wheezing or shortness of breath.   Albuterol Sulfate 108 (90 Base) MCG/ACT AEPB Inhale into the lungs. Nebulizer   amitriptyline (ELAVIL) 10 MG tablet Take 10 mg by mouth at bedtime.   amLODipine (NORVASC) 5 MG tablet Take 5 mg by mouth daily. Patient takes 1/2 in the morning and 1/2 at supper.   arformoterol (BROVANA) 15 MCG/2ML NEBU Inhale 15 mcg into the lungs in the morning and at bedtime.   Butalbital-APAP-Caff-Cod 50-300-40-30 MG CAPS Take by mouth as needed.   Ca Carbonate-Mag Hydroxide (ROLAIDS PO) Take by mouth. Patient states that she uses 2-3 times weekly.   Cyanocobalamin (VITAMIN B-12 IJ) Inject as directed every 21 ( twenty-one) days.    denosumab (PROLIA) 60 MG/ML SOSY injection Inject 60 mg into the skin every 6 (six) months.   diazepam (VALIUM) 2 MG tablet Take 2 mg by mouth. Patient takes as needed for dizziness.   dicyclomine (BENTYL) 10 MG capsule TAKE 1 CAPSULE (10 MG TOTAL) BY MOUTH EVERY 8 (EIGHT) HOURS AS NEEDED FOR SPASMS.   diphenhydrAMINE (BENADRYL) 25 MG tablet Take 25 mg by mouth at bedtime.   ergocalciferol (VITAMIN D2) 1.25 MG (50000 UT) capsule Take 50,000 Units by mouth once a week. Patient states that she takes 1 by mouth twice a week.   levothyroxine (SYNTHROID) 25 MCG tablet Take 25 mcg by mouth daily.   linaclotide (LINZESS) 72 MCG capsule Take 1 capsule (72 mcg total) by  mouth daily before breakfast.   meclizine (ANTIVERT) 12.5 MG tablet Take 12.5 mg by mouth 3 (three) times daily as needed for dizziness.   metFORMIN (GLUCOPHAGE-XR) 500 MG 24 hr tablet Take 2 tablets by mouth in the morning and at bedtime.   METOPROLOL TARTRATE PO Take by mouth. 12.5 mg in the morning and 12.5 mg in at night.   OXYGEN Inhale 2 L into the lungs continuous. 3 L at night   pantoprazole (PROTONIX) 40 MG tablet TAKE 1 TABLET BY MOUTH  EVERY DAY   pravastatin (PRAVACHOL) 10 MG tablet Take 10 mg by mouth daily.   Wheat Dextrin (BENEFIBER ON THE GO PO) Take 1 packet by mouth. 3/3.5 gram packet daily from [place] [facility] encounter 06/30/2019   No facility-administered encounter medications on file as of 04/26/2021.    Allergies as of 04/26/2021 - Review Complete 04/19/2021  Allergen Reaction Noted   Eggs or egg-derived products Anaphylaxis and Other (See Comments) 02/08/2009   Iodinated contrast media Hives 01/02/2019   Lisinopril  01/28/2015   Adhesive [tape]  01/02/2019   Asa [aspirin]  01/02/2019   Asmanex (120 metered doses) [mometasone furoate]  01/02/2019   Biaxin [clarithromycin]  01/02/2019   Erythromycin  01/02/2019   Evista [raloxifene hcl]  01/02/2019   Influenza vaccines  01/02/2019   Nexium [esomeprazole magnesium]  01/02/2019   Norco [hydrocodone-acetaminophen]  01/02/2019   Oxycodone  01/02/2019   Prilosec [omeprazole]  01/02/2019   Qvar [beclomethasone]  01/02/2019   Tessalon [benzonatate]  01/02/2019   Topiramate Itching 06/19/2018    Past Medical History:  Diagnosis Date   Allergic rhinitis from [place] [facility] encounter 06/30/2019   Allergy    SEASONAL   Anxiety disorder    from [facility] encounter 06/30/2019   Arthritis of lumbosacral spine    Asthma    Asthma    from [place] [facility] encounter 06/30/2019   Cataract    BILATERAL-REMOVED   Cervical arthritis    Chest pain    Colitis 11/25/1999   from [facility] clinic encounter 06/30/2019   Colitis due to Clostridium difficile 07/14/2003   from [place] [facility] encounter 06/30/2019   Colon polyp    from [facility] [facility] encounter 06/30/2019   Diabetes ([place])    Diarrhea    Dizzinesses    Essential hypertension    GERD (gastroesophageal reflux disease)    H. pylori infection    Hx of labyrinthitis 08/14/2006   from [place] clinic encounter 06/30/2019   Hyperlipemia    Hypokalemia 08/04/2003   from [place] [facility] encounter 06/30/2019    Hypokalemia 08/04/2003   Hypothyroid    Menopausal syndrome    from [place] [facility] encounter 06/30/2019   Neck pain    from [place] clinic encounter 06/30/2019   Osteoporosis    Palpitations    Preoperative cardiovascular examination    SNHL (sensorineural hearing loss)    from [place] encounter 06/30/2019   Stroke [facility]    Unsteady gait    from [place] [facility] encounter 06/30/2019    Past Surgical History:  Procedure Laterality Date   ABDOMINAL HYSTERECTOMY     ALLOGRAFT SPINE MORSELIZED Bilateral 1226/2017   from [place] clinic encounter 06/30/2019, DrDavid Prior   APPENDECTOMY     CATARACT EXTRACTION Bilateral    CHOLECYSTECTOMY     COLONOSCOPY     LAPAROTOMY     with mesh (per pt, bowel tissue adhered due to endometriosis)   TONSILLECTOMY     UPPER GASTROINTESTINAL ENDOSCOPY  Family History  Problem Relation Age of Onset   Osteoporosis Mother    Stroke Mother    Pancreatic cancer Mother    Hypertension Father    Epilepsy Sister    Hypertension Sister    Liver cancer Sister    Diabetes Sister    Hypertension Sister    Healthy Sister    Hypertension Sister    Hypertension Sister    Hypertension Sister    Lung disease Sister    Hypertension Sister    Cirrhosis Sister    Hepatitis C Sister    Epilepsy Brother    Colon cancer Brother    Hypertension Brother    Other Brother        Brain bleed   Leukemia Brother    Stomach cancer Paternal Aunt    Celiac disease Nephew    Celiac disease Niece    Esophageal cancer Neg Hx    Rectal cancer Neg Hx     Social History   Socioeconomic History   Marital status: Married    Spouse name: Not on file   Number of children: Not on file   Years of education: Not on file   Highest education level: Not on file  Occupational History   Not on file  Tobacco Use   Smoking status: Never   Smokeless tobacco: Never  Vaping Use   Vaping Use: Never used  Substance and Sexual Activity   Alcohol use: Never   Drug  use: Never   Sexual activity: Yes    Partners: Male    Birth control/protection: Surgical  Other Topics Concern   Not on file  Social History Narrative   Not on file   Social Determinants of Health   Financial Resource Strain: Not on file  Food Insecurity: Not on file  Transportation Needs: Not on file  Physical Activity: Not on file  Stress: Not on file  Social Connections: Not on file  Intimate Partner Violence: Not on file      Review of systems: All other review of systems negative except as mentioned in the HPI.   Physical Exam: Vitals:   04/26/21 1338  BP: 120/70  Pulse: 69   Body mass index is 38.04 kg/m. Gen:      No acute distress HEENT:  sclera anicteric Abd:      soft, non-tender; no palpable masses, no distension Ext:    No edema Neuro: alert and oriented x 3 Psych: normal mood and affect  Data Reviewed:  Reviewed labs, radiology imaging, old records and pertinent past GI work up   Assessment and Plan/Recommendations:  66 year old very pleasant female with history of endometriosis, ex lap, TAH, BSO, appendectomy and cholecystectomy with extensive intra-abdominal and pelvic adhesions with irritable bowel syndrome with predominant constipation   Nausea and vomiting: Multifactorial, rapid gastric emptying likely contributing to her symptoms Improved with small frequent meals with high-protein diet Avoid sugar or high carb diet   IBS predominant constipation Continue Linzess 17mcg and increase water intake Add MiraLAX 1 capful every other day and titrate based on response   Hemochromatosis and fatty liver: Normal LFT.  She gets intermittent phlebotomy through hematology   Intermittent abdominal cramping: Use dicyclomine 10 mg every 8 hours as needed   GERD: Continue Protonix and antireflux measures   Return in 3 to 4 months or sooner if needed  This visit required 30 minutes of patient care (this includes precharting, chart review, review of  results, face-to-face time used for counseling  as well as treatment plan and follow-up. The patient was provided an opportunity to ask questions and all were answered. The patient agreed with the plan and demonstrated an understanding of the instructions.  Damaris Hippo , MD    CC: [name], MD

## 2021-05-30 IMAGING — DX DG ABDOMEN 2V
3 series · 3 of 3 positions shown · non-contrast
Comparison: August 15, 2019

CLINICAL DATA: Constipation.

EXAM:
ABDOMEN - 2 VIEW

[abdomen erect]
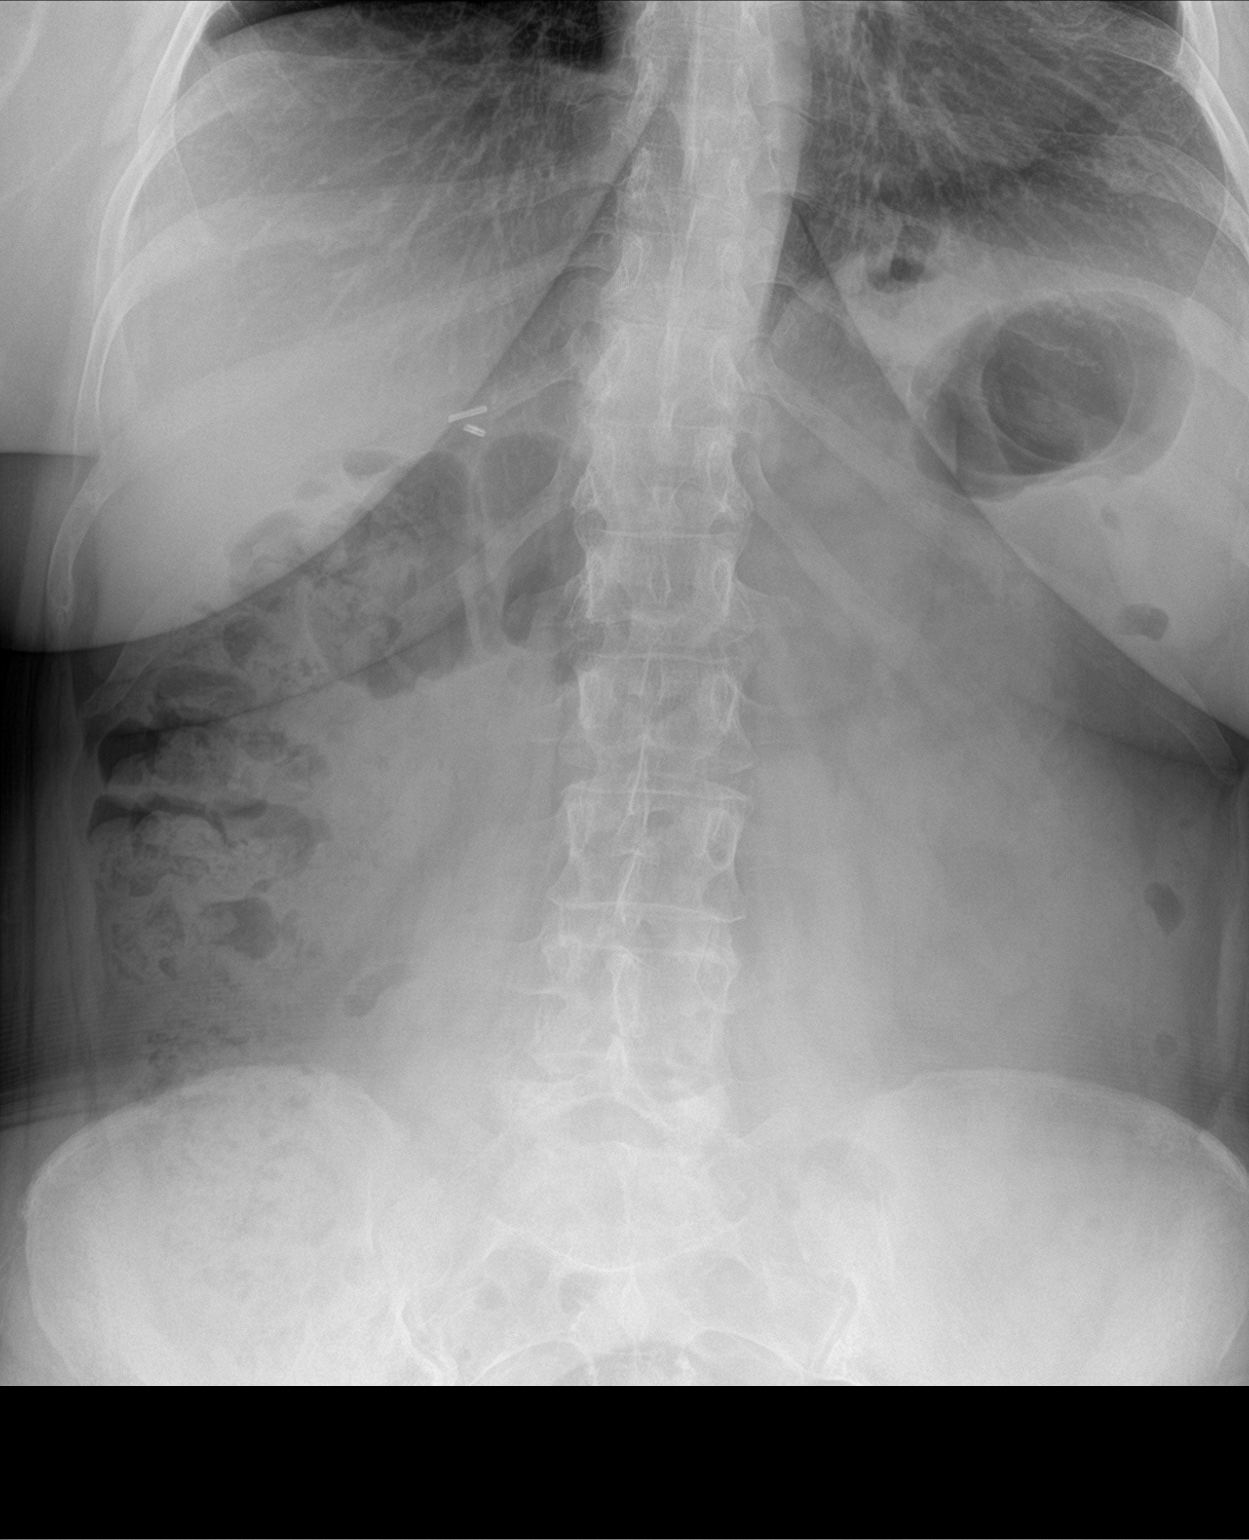

[abdomen supine (1 of 2)]
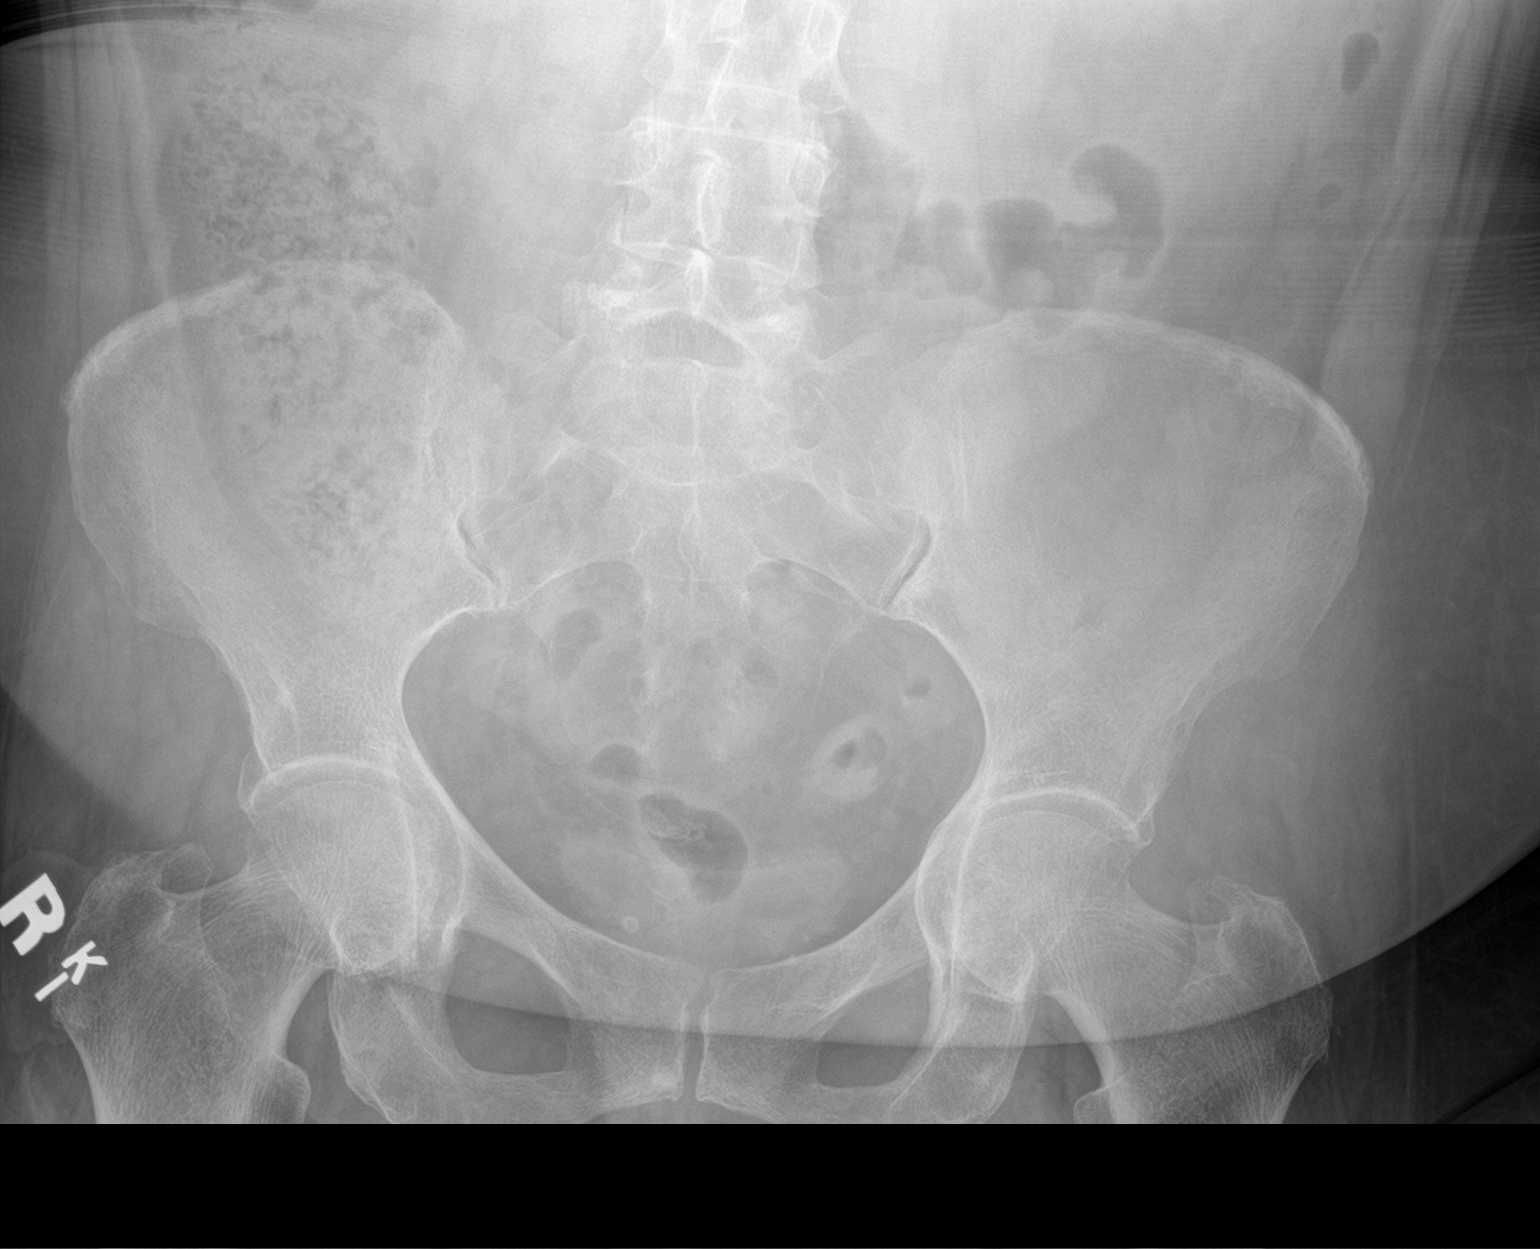

[abdomen supine (2 of 2)]
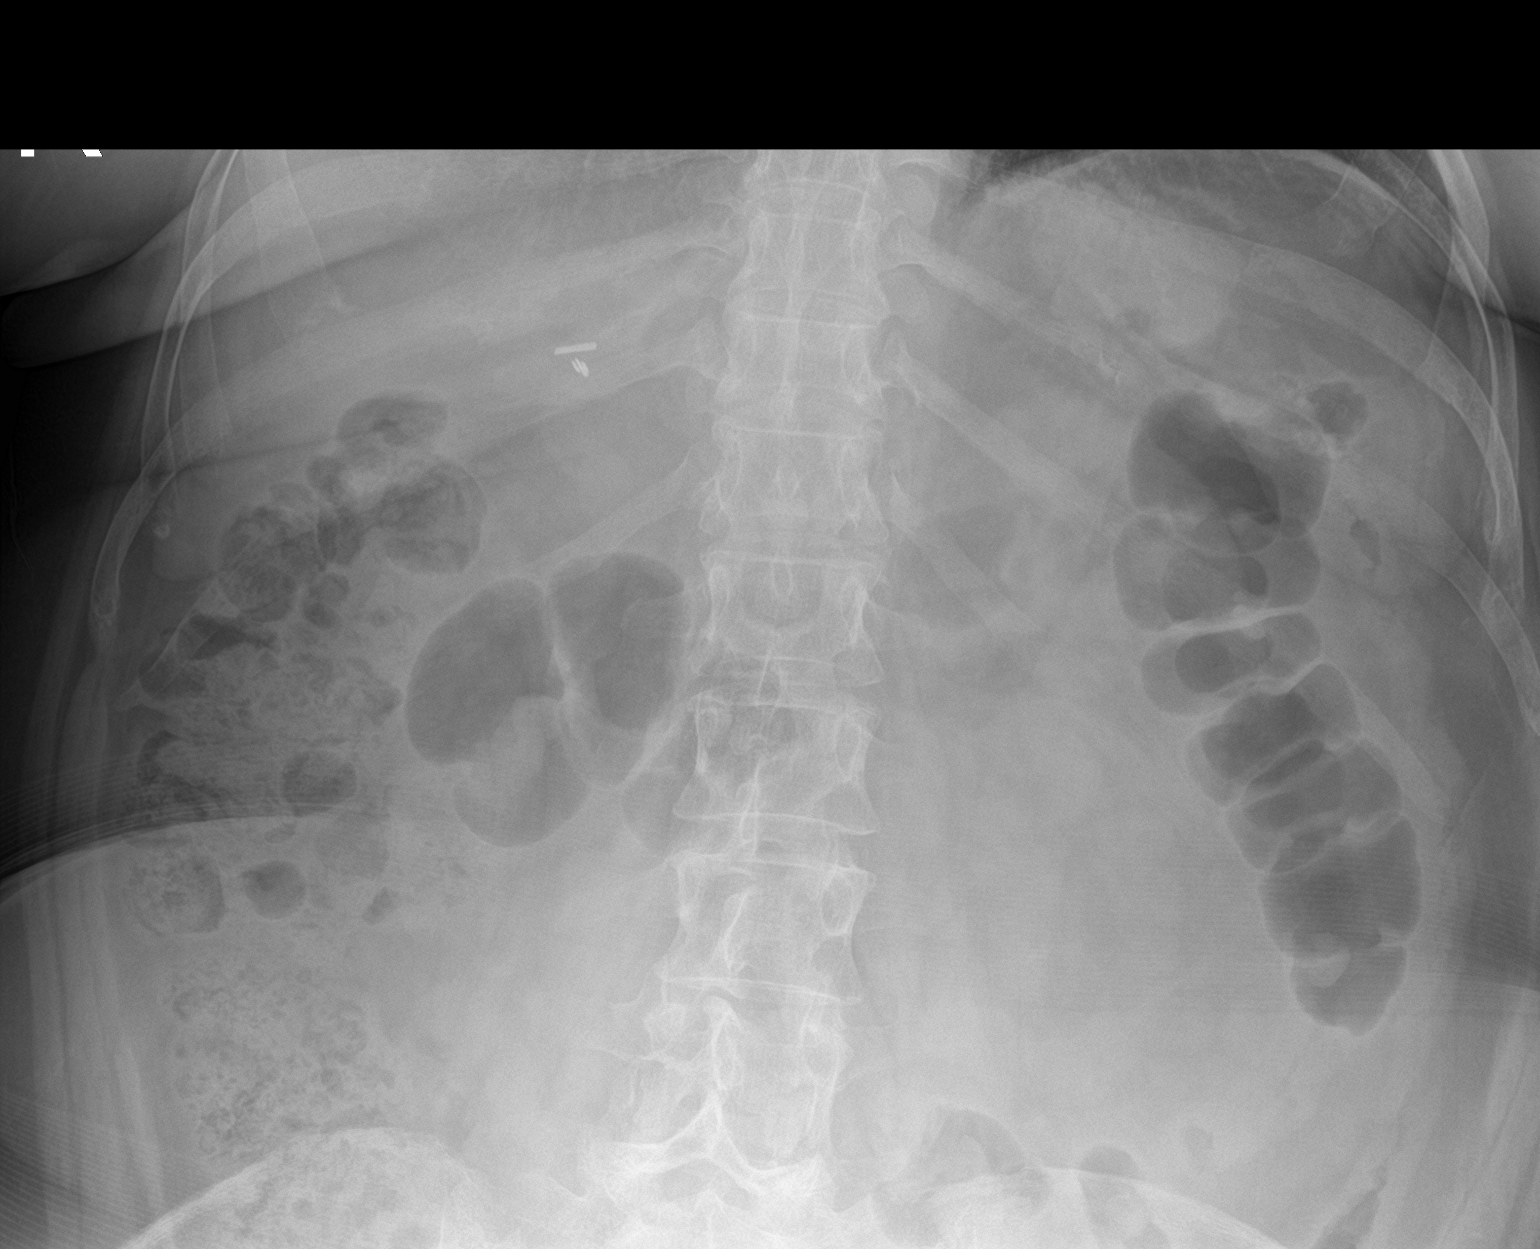

[3 of 3 positions shown; findings below may reference images not displayed]

FINDINGS: There is a large amount of stool in the right hemicolon. The bowel
gas pattern is nonobstructive. There are no radiopaque kidney
stones. Degenerative changes are noted of the hips and spine. The
patient appears to be status post prior cholecystectomy.
IMPRESSION: Large amount of stool in the right hemicolon.

## 2021-06-22 ENCOUNTER — Other Ambulatory Visit: Payer: Self-pay | Admitting: Gastroenterology

## 2021-07-27 ENCOUNTER — Ambulatory Visit (INDEPENDENT_AMBULATORY_CARE_PROVIDER_SITE_OTHER): Payer: Medicare Other | Admitting: Gastroenterology

## 2021-07-27 ENCOUNTER — Encounter: Payer: Self-pay | Admitting: Gastroenterology

## 2021-07-27 VITALS — BP 148/70 | HR 55 | Ht 62.0 in | Wt 213.0 lb

## 2021-07-27 DIAGNOSIS — R140 Abdominal distension (gaseous): Secondary | ICD-10-CM

## 2021-07-27 DIAGNOSIS — R109 Unspecified abdominal pain: Secondary | ICD-10-CM

## 2021-07-27 DIAGNOSIS — K581 Irritable bowel syndrome with constipation: Secondary | ICD-10-CM | POA: Diagnosis not present

## 2021-07-27 MED ORDER — LINACLOTIDE 72 MCG PO CAPS: 72.0000 ug | ORAL_CAPSULE | Freq: Every day | ORAL | 6 refills | Status: DC

## 2021-07-27 NOTE — Progress Notes (Signed)
? ?       ? ?Laurie Finley    [id number]    10-May-1955 ? ?Primary Care Physician:Eason, Renae Fickle, MD ? ?Referring Physician: [name], MD ?[phone] ?[place],  [place] [address] ? ? ?Chief complaint: Lower abdominal cramping and discomfort ? ?HPI: ? ?66 year old very pleasant female here for follow-up visit for generalized abdominal pain, constipation and intermittent nausea ?  ?   ?She is taking Linzess 72 mcg daily but uses MiraLAX only occasionally.  On average she is having daily bowel movement but it hurts in the lower abdomen when she has to use the bathroom.  She is passing hard stool and skips some days ? ? She is no longer vomiting and does not have nausea every day, uses meclizine as needed for intermittent episodes of nausea.  She continues to have intermittent abdominal discomfort and cramping which improves when she takes dicyclomine but it takes 30 to 40 minutes before it starts working ?  ?Diarrhea improved after she completed course of Xifaxan. ?  ?Gastric emptying study 10/23/20 ?Expected location of the stomach in the left upper quadrant. ?Ingested meal empties the stomach gradually over the course of the ?study with 11.24% retention at 60 min and 2.55% retention at 120 min ?(normal retention less than 30% at a 120 min). ?  ?TTG Ig Ab negative for celiac disease ?  ?History of hemochromatosis, gets periodic phlebotomy for iron chelation. ?  ?CT abdomen and pelvis 08/15/2019 ?No CT findings to account for the patient's chronic abdominal pain. ?Status post cholecystectomy, appendectomy, and hysterectomy. ?  ?Colonoscopy October 03, 2019: For diarrhea evaluation, random colon biopsies negative for microscopic colitis.  Diverticulosis and internal hemorrhoid ?EGD October 03, 2019: Mild erosive esophagitis secondary to reflux, hiatal hernia, CLOtest negative for H. pylori otherwise normal exam ?  ?  ? ? ?Outpatient Encounter Medications as of 07/27/2021  ?Medication Sig  ? acetaminophen (TYLENOL) 500 MG  tablet Take 500 mg by mouth every 6 (six) hours as needed for mild pain or moderate pain.  ? albuterol (VENTOLIN HFA) 108 (90 Base) MCG/ACT inhaler Inhale 1 puff into the lungs every 6 (six) hours as needed for wheezing or shortness of breath.  ? Albuterol Sulfate 108 (90 Base) MCG/ACT AEPB Inhale into the lungs. Nebulizer  ? amitriptyline (ELAVIL) 10 MG tablet Take 10 mg by mouth at bedtime.  ? amLODipine (NORVASC) 5 MG tablet Take 5 mg by mouth daily. Patient takes 1/2 in the morning and 1/2 at supper.  ? arformoterol (BROVANA) 15 MCG/2ML NEBU Inhale 15 mcg into the lungs in the morning and at bedtime.  ? Butalbital-APAP-Caff-Cod 50-300-40-30 MG CAPS Take by mouth as needed.  ? Ca Carbonate-Mag Hydroxide (ROLAIDS PO) Take by mouth. Patient states that she uses 2-3 times weekly.  ? Cyanocobalamin (VITAMIN B-12 IJ) Inject as directed every 21 ( twenty-one) days.   ? denosumab (PROLIA) 60 MG/ML SOSY injection Inject 60 mg into the skin every 6 (six) months.  ? diazepam (VALIUM) 2 MG tablet Take 2 mg by mouth. Patient takes as needed for dizziness.  ? diclofenac Sodium (VOLTAREN) 1 % GEL Apply 2 g topically 2 (two) times daily.  ? dicyclomine (BENTYL) 10 MG capsule TAKE 1 CAPSULE BY MOUTH EVERY 8 HOURS AS NEEDED FOR SPASMS  ? diphenhydrAMINE (BENADRYL) 25 MG tablet Take 25 mg by mouth at bedtime.  ? ergocalciferol (VITAMIN D2) 1.25 MG (50000 UT) capsule Take 50,000 Units by mouth once a week. Patient states that she takes  1 by mouth twice a week.  ? levothyroxine (SYNTHROID) 25 MCG tablet Take 25 mcg by mouth daily.  ? linaclotide (LINZESS) 72 MCG capsule Take 1 capsule (72 mcg total) by mouth daily before breakfast.  ? meclizine (ANTIVERT) 12.5 MG tablet Take 12.5 mg by mouth 3 (three) times daily as needed for dizziness.  ? metFORMIN (GLUCOPHAGE-XR) 500 MG 24 hr tablet Take 2 tablets by mouth in the morning and at bedtime.  ? METOPROLOL TARTRATE PO Take by mouth. 12.5 mg in the morning and 12.5 mg in at night.  ?  OXYGEN Inhale 2 L into the lungs continuous. 3 L at night  ? pantoprazole (PROTONIX) 40 MG tablet TAKE 1 TABLET BY MOUTH EVERY DAY  ? pravastatin (PRAVACHOL) 10 MG tablet Take 10 mg by mouth daily.  ? ?No facility-administered encounter medications on file as of 07/27/2021.  ? ? ?Allergies as of 07/27/2021 - Review Complete 04/26/2021  ?Allergen Reaction Noted  ? Eggs or egg-derived products Anaphylaxis and Other (See Comments) 02/08/2009  ? Iodinated contrast media Hives 01/02/2019  ? Lisinopril  01/28/2015  ? Adhesive [tape]  01/02/2019  ? Asa [aspirin]  01/02/2019  ? Asmanex (120 metered doses) [mometasone furoate]  01/02/2019  ? Biaxin [clarithromycin]  01/02/2019  ? Erythromycin  01/02/2019  ? Evista [raloxifene hcl]  01/02/2019  ? Influenza vaccines  01/02/2019  ? Nexium [esomeprazole magnesium]  01/02/2019  ? Norco [hydrocodone-acetaminophen]  01/02/2019  ? Oxycodone  01/02/2019  ? Prilosec [omeprazole]  01/02/2019  ? Qvar [beclomethasone]  01/02/2019  ? Tessalon [benzonatate]  01/02/2019  ? Topiramate Itching 06/19/2018  ? ? ?Past Medical History:  ?Diagnosis Date  ? Allergic rhinitis from [place] clinic encounter 06/30/2019  ? Allergy   ? SEASONAL  ? Anxiety disorder   ? from [facility] encounter 06/30/2019  ? Arthritis of lumbosacral spine   ? Asthma   ? Asthma   ? from [facility] clinic encounter 06/30/2019  ? Cataract   ? BILATERAL-REMOVED  ? Cervical arthritis   ? Chest pain   ? Colitis 11/25/1999  ? from [facility] clinic encounter 06/30/2019  ? Colitis due to Clostridium difficile 07/14/2003  ? from [facility] encounter 06/30/2019  ? Colon polyp   ? from [facility] encounter 06/30/2019  ? Diabetes ([facility])   ? Diarrhea   ? Dizzinesses   ? Essential hypertension   ? GERD (gastroesophageal reflux disease)   ? H. pylori infection   ? Hx of labyrinthitis 08/14/2006  ? from [facility] encounter 06/30/2019  ? Hyperlipemia   ? Hypokalemia 08/04/2003  ? from [facility] clinic encounter 06/30/2019  ? Hypokalemia  08/04/2003  ? Hypothyroid   ? Menopausal syndrome   ? from [facility] clinic encounter 06/30/2019  ? Neck pain   ? from [facility] clinic encounter 06/30/2019  ? Osteoporosis   ? Palpitations   ? Preoperative cardiovascular examination   ? SNHL (sensorineural hearing loss)   ? from [facility] clinic encounter 06/30/2019  ? Stroke [facility]   ? Unsteady gait   ? from [facility] clinic encounter 06/30/2019  ? ? ?Past Surgical History:  ?Procedure Laterality Date  ? ABDOMINAL HYSTERECTOMY    ? ALLOGRAFT SPINE MORSELIZED Bilateral 1226/2017  ? from [place] clinic encounter 06/30/2019, DrDavid Prior  ? APPENDECTOMY    ? CATARACT EXTRACTION Bilateral   ? CHOLECYSTECTOMY    ? COLONOSCOPY    ? LAPAROTOMY    ? with mesh (per pt, bowel tissue adhered due to endometriosis)  ? TONSILLECTOMY    ?  UPPER GASTROINTESTINAL ENDOSCOPY    ? ? ?Family History  ?Problem Relation Age of Onset  ? Osteoporosis Mother   ? Stroke Mother   ? Pancreatic cancer Mother   ? Hypertension Father   ? Epilepsy Sister   ? Hypertension Sister   ? Liver cancer Sister   ? Diabetes Sister   ? Hypertension Sister   ? Healthy Sister   ? Hypertension Sister   ? Hypertension Sister   ? Hypertension Sister   ? Lung disease Sister   ? Hypertension Sister   ? Cirrhosis Sister   ? Hepatitis C Sister   ? Epilepsy Brother   ? Colon cancer Brother   ? Hypertension Brother   ? Other Brother   ?     Brain bleed  ? Leukemia Brother   ? Stomach cancer Paternal Aunt   ? Celiac disease Nephew   ? Celiac disease Niece   ? Esophageal cancer Neg Hx   ? Rectal cancer Neg Hx   ? ? ?Social History  ? ?Socioeconomic History  ? Marital status: Married  ?  Spouse name: Orvilla Fus  ? Number of children: 0  ? Years of education: Not on file  ? Highest education level: Not on file  ?Occupational History  ? Occupation: retired  ?Tobacco Use  ? Smoking status: Never  ? Smokeless tobacco: Never  ?Vaping Use  ? Vaping Use: Never used  ?Substance and Sexual Activity  ? Alcohol use: Never  ? Drug use: Never  ?  Sexual activity: Yes  ?  Partners: Male  ?  Birth control/protection: Surgical  ?Other Topics Concern  ? Not on file  ?Social History Narrative  ? Not on file  ? ?Social Determinants of Health  ? ?Financial Res

## 2021-07-27 NOTE — Patient Instructions (Signed)
We have sent the following medications to your pharmacy for you to pick up at your convenience:  Linzess ? ?Take Miralax 1/2 capful daily ? ?Follow up in 3 months ? ?If you are age 67 or older, your body mass index should be between 23-30. Your Body mass index is 38.96 kg/m?Marland Kitchen If this is out of the aforementioned range listed, please consider follow up with your Primary Care Provider. ? ?If you are age 4 or younger, your body mass index should be between 19-25. Your Body mass index is 38.96 kg/m?Marland Kitchen If this is out of the aformentioned range listed, please consider follow up with your Primary Care Provider.  ? ?________________________________________________________ ? ?The [facility] providers would like to encourage you to use [facility] to communicate with providers for non-urgent requests or questions.  Due to long hold times on the telephone, sending your provider a message by [facility] may be a faster and more efficient way to get a response.  Please allow 48 business hours for a response.  Please remember that this is for non-urgent requests.  ?_______________________________________________________  ? ?I appreciate the  opportunity to care for you ? ?Thank You  ? ?Marsa Aris , MD  ?

## 2021-10-18 ENCOUNTER — Other Ambulatory Visit: Payer: Self-pay

## 2021-10-18 ENCOUNTER — Other Ambulatory Visit: Payer: Self-pay | Admitting: Medical Oncology

## 2021-10-18 ENCOUNTER — Inpatient Hospital Stay: Payer: Medicare Other | Attending: Internal Medicine

## 2021-10-18 ENCOUNTER — Inpatient Hospital Stay: Payer: Medicare Other

## 2021-10-18 ENCOUNTER — Ambulatory Visit (HOSPITAL_BASED_OUTPATIENT_CLINIC_OR_DEPARTMENT_OTHER)
Admission: RE | Admit: 2021-10-18 | Discharge: 2021-10-18 | Disposition: A | Discharge status: Home / Self Care | Payer: Medicare Other | Source: Ambulatory Visit | Attending: Internal Medicine | Admitting: Internal Medicine

## 2021-10-18 ENCOUNTER — Inpatient Hospital Stay (HOSPITAL_BASED_OUTPATIENT_CLINIC_OR_DEPARTMENT_OTHER): Payer: Medicare Other | Admitting: Internal Medicine

## 2021-10-18 VITALS — BP 96/66 | HR 54 | Temp 97.5°F | Resp 16 | Ht 62.0 in | Wt 215.4 lb

## 2021-10-18 DIAGNOSIS — D751 Secondary polycythemia: Secondary | ICD-10-CM | POA: Insufficient documentation

## 2021-10-18 DIAGNOSIS — M7989 Other specified soft tissue disorders: Secondary | ICD-10-CM | POA: Insufficient documentation

## 2021-10-18 DIAGNOSIS — M5481 Occipital neuralgia: Secondary | ICD-10-CM | POA: Insufficient documentation

## 2021-10-18 LAB — CBC WITH DIFFERENTIAL (CANCER CENTER ONLY)
Abs Immature Granulocytes: 0.05 10*3/uL (ref 0.00–0.07)
Basophils Absolute: 0.1 10*3/uL (ref 0.0–0.1)
Basophils Relative: 1 %
Eosinophils Absolute: 0.4 10*3/uL (ref 0.0–0.5)
Eosinophils Relative: 4 %
HCT: 36.9 % (ref 36.0–46.0)
Hemoglobin: 12.2 g/dL (ref 12.0–15.0)
Immature Granulocytes: 1 %
Lymphocytes Relative: 28 %
Lymphs Abs: 2.5 10*3/uL (ref 0.7–4.0)
MCH: 28.4 pg (ref 26.0–34.0)
MCHC: 33.1 g/dL (ref 30.0–36.0)
MCV: 85.8 fL (ref 80.0–100.0)
Monocytes Absolute: 0.8 10*3/uL (ref 0.1–1.0)
Monocytes Relative: 9 %
Neutro Abs: 5 10*3/uL (ref 1.7–7.7)
Neutrophils Relative %: 57 %
Platelet Count: 227 10*3/uL (ref 150–400)
RBC: 4.3 MIL/uL (ref 3.87–5.11)
RDW: 13.6 % (ref 11.5–15.5)
WBC Count: 8.9 10*3/uL (ref 4.0–10.5)
nRBC: 0 % (ref 0.0–0.2)

## 2021-10-18 LAB — CMP (CANCER CENTER ONLY)
ALT: 11 U/L (ref 0–44)
AST: 16 U/L (ref 15–41)
Albumin: 4.2 g/dL (ref 3.5–5.0)
Alkaline Phosphatase: 55 U/L (ref 38–126)
Anion gap: 7 (ref 5–15)
BUN: 9 mg/dL (ref 8–23)
CO2: 28 mmol/L (ref 22–32)
Calcium: 9.9 mg/dL (ref 8.9–10.3)
Chloride: 101 mmol/L (ref 98–111)
Creatinine: 0.77 mg/dL (ref 0.44–1.00)
GFR, Estimated: >60 mL/min (ref >60)
Glucose, Bld: 121 mg/dL — ABNORMAL HIGH (ref 70–99)
Potassium: 3.8 mmol/L (ref 3.5–5.1)
Sodium: 136 mmol/L (ref 135–145)
Total Bilirubin: 0.2 mg/dL — ABNORMAL LOW (ref 0.3–1.2)
Total Protein: 7.2 g/dL (ref 6.5–8.1)

## 2021-10-18 LAB — IRON AND IRON BINDING CAPACITY (CC-WL,HP ONLY)
Iron: 82 ug/dL (ref 28–170)
Saturation Ratios: 19 % (ref 10.4–31.8)
TIBC: 440 ug/dL (ref 250–450)
UIBC: 358 ug/dL (ref 148–442)

## 2021-10-18 LAB — FERRITIN: Ferritin: 17 ng/mL (ref 11–307)

## 2021-10-18 NOTE — Progress Notes (Signed)
Left lower extremity venous duplex has been completed. Preliminary results can be found in CV Proc through chart review.  Results were given to Ansi at Dr. Asa Lente office.  10/18/21 11:30 AM Olen Cordial RVT

## 2021-10-18 NOTE — Progress Notes (Signed)
[facility] [phone]   [phone]  OFFICE PROGRESS NOTE  [name], MD 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  Laurie Finley has negative JAK2 mutation panel.  PRIOR THERAPY: None  CURRENT THERAPY: Phlebotomy on as-needed basis.  INTERVAL HISTORY: Laurie Finley 66 y.o. female returns to the clinic today for follow-up visit accompanied by her husband.  The patient has several complaints today including swelling of the left lower extremity as well as pain from occipital neuralgia.  Laurie Finley also has cardiac arrhythmia in March 2023 and Laurie Finley started treatment with amiodarone.  Laurie Finley is supposed to have cervical spine surgery on October 31, 2021.  The patient denied having any current fatigue or weakness.  Laurie Finley has no nausea, vomiting, diarrhea or constipation.  Laurie Finley denied having any chest pain but continues to have the baseline shortness of breath and Laurie Finley is currently on home oxygen.  Laurie Finley has no fever or chills.  Laurie Finley is here today for evaluation and repeat blood work.   MEDICAL HISTORY: Past Medical History:  Diagnosis Date   Allergic rhinitis from [facility] encounter 06/30/2019   Allergy    SEASONAL   Anxiety disorder    from [facility] encounter 06/30/2019   Arthritis of lumbosacral spine    Asthma    Asthma    from [facility] clinic encounter 06/30/2019   Cataract    BILATERAL-REMOVED   Cervical arthritis    Chest pain    Colitis 11/25/1999   from [facility] clinic encounter 06/30/2019   Colitis due to Clostridium difficile 07/14/2003   from [facility] clinic encounter 06/30/2019   Colon polyp    from [facility] clinic encounter 06/30/2019   Diabetes (HCC)    Diarrhea    Dizzinesses    Essential hypertension    GERD (gastroesophageal reflux disease)    H. pylori infection    Hx of labyrinthitis 08/14/2006    from [facility] clinic encounter 06/30/2019   Hyperlipemia    Hypokalemia 08/04/2003   from [facility] clinic encounter 06/30/2019   Hypokalemia 08/04/2003   Hypothyroid    Menopausal syndrome    from [facility] clinic encounter 06/30/2019   Neck pain    from [facility] clinic encounter 06/30/2019   Osteoporosis    Palpitations    Preoperative cardiovascular examination    SNHL (sensorineural hearing loss)    from [facility] clinic encounter 06/30/2019   Stroke [facility])    Unsteady gait    from [facility] clinic encounter 06/30/2019    ALLERGIES:  is allergic to eggs or egg-derived products, iodinated contrast media, lisinopril, adhesive [tape], asa [aspirin], asmanex (120 metered doses) [mometasone furoate], biaxin [clarithromycin], erythromycin, evista [raloxifene hcl], influenza vaccines, nexium [esomeprazole magnesium], norco [hydrocodone-acetaminophen], oxycodone, prilosec [omeprazole], qvar [beclomethasone], tessalon [benzonatate], and topiramate.  MEDICATIONS:  Current Outpatient Medications  Medication Sig Dispense Refill   acetaminophen (TYLENOL) 500 MG tablet Take 500 mg by mouth every 6 (six) hours as needed for mild pain or moderate pain.     albuterol (VENTOLIN HFA) 108 (90 Base) MCG/ACT inhaler Inhale 1 puff into the lungs every 6 (six) hours as needed for wheezing or shortness of breath.     Albuterol Sulfate 108 (90 Base) MCG/ACT AEPB Inhale into the lungs. Nebulizer     amiodarone (PACERONE) 100 MG tablet Take 100 mg by mouth daily.     amitriptyline (ELAVIL) 10 MG  tablet Take 10 mg by mouth at bedtime.     arformoterol (BROVANA) 15 MCG/2ML NEBU Inhale 15 mcg into the lungs in the morning and at bedtime.     Butalbital-APAP-Caff-Cod 50-300-40-30 MG CAPS Take by mouth as needed.     Ca Carbonate-Mag Hydroxide (ROLAIDS PO) Take by mouth. Patient states that Laurie Finley uses 2-3 times weekly.     Cyanocobalamin (VITAMIN B-12 IJ) Inject as directed every 21 ( twenty-one) days.      denosumab (PROLIA) 60  MG/ML SOSY injection Inject 60 mg into the skin every 6 (six) months.     diazepam (VALIUM) 2 MG tablet Take 2 mg by mouth. Patient takes as needed for dizziness.     dicyclomine (BENTYL) 10 MG capsule TAKE 1 CAPSULE BY MOUTH EVERY 8 HOURS AS NEEDED FOR SPASMS 30 capsule 2   diphenhydrAMINE (BENADRYL) 25 MG tablet Take 25 mg by mouth at bedtime.     ergocalciferol (VITAMIN D2) 1.25 MG (50000 UT) capsule Take 50,000 Units by mouth once a week. Patient states that Laurie Finley takes 1 by mouth twice a week.     levothyroxine (SYNTHROID) 25 MCG tablet Take 25 mcg by mouth daily.     linaclotide (LINZESS) 72 MCG capsule Take 1 capsule (72 mcg total) by mouth daily before breakfast. 30 capsule 6   meclizine (ANTIVERT) 12.5 MG tablet Take 12.5 mg by mouth 3 (three) times daily as needed for dizziness.     metFORMIN (GLUCOPHAGE-XR) 500 MG 24 hr tablet Take 2 tablets by mouth in the morning and at bedtime.     metoprolol tartrate (LOPRESSOR) 50 MG tablet Take 75 mg by mouth 2 (two) times daily.     OXYGEN Inhale 2 L into the lungs continuous. 3 L at night     pantoprazole (PROTONIX) 40 MG tablet TAKE 1 TABLET BY MOUTH EVERY DAY 90 tablet 3   pravastatin (PRAVACHOL) 10 MG tablet Take 10 mg by mouth daily.     No current facility-administered medications for this visit.    SURGICAL HISTORY:  Past Surgical History:  Procedure Laterality Date   ABDOMINAL HYSTERECTOMY     ALLOGRAFT SPINE MORSELIZED Bilateral 1226/2017   from [facility] encounter 06/30/2019, DrDavid Prior   APPENDECTOMY     CATARACT EXTRACTION Bilateral    CHOLECYSTECTOMY     COLONOSCOPY     LAPAROTOMY     with mesh (per pt, bowel tissue adhered due to endometriosis)   TONSILLECTOMY     UPPER GASTROINTESTINAL ENDOSCOPY      REVIEW OF SYSTEMS:  Constitutional: positive for fatigue Eyes: negative Ears, nose, mouth, throat, and face: negative Respiratory: positive for cough and dyspnea on exertion Cardiovascular:  negative Gastrointestinal: negative Genitourinary:negative Integument/breast: negative Hematologic/lymphatic: negative Musculoskeletal:positive for muscle weakness and myalgias Neurological: positive for weakness Behavioral/Psych: negative Endocrine: negative Allergic/Immunologic: negative   PHYSICAL EXAMINATION: General appearance: alert, cooperative, fatigued, and no distress Head: Normocephalic, without obvious abnormality, atraumatic Neck: no adenopathy, no JVD, supple, symmetrical, trachea midline, and thyroid not enlarged, symmetric, no tenderness/mass/nodules Lymph nodes: Cervical, supraclavicular, and axillary nodes normal. Resp: clear to auscultation bilaterally Back: symmetric, no curvature. ROM normal. No CVA tenderness. Cardio: regular rate and rhythm, S1, S2 normal, no murmur, click, rub or gallop GI: soft, non-tender; bowel sounds normal; no masses,  no organomegaly Extremities: extremities normal, atraumatic, no cyanosis or edema Neurologic: Alert and oriented X 3, normal strength and tone. Normal symmetric reflexes. Normal coordination and gait  ECOG PERFORMANCE STATUS: 1 - Symptomatic but  completely ambulatory  Blood pressure 96/66, pulse (!) 54, temperature (!) 97.5 F (36.4 C), temperature source Oral, resp. rate 16, height 5\' 2"  (1.575 m), weight 215 lb 6.4 oz (97.7 kg), SpO2 99 %.  LABORATORY DATA: Lab Results  Component Value Date   WBC 8.9 10/18/2021   HGB 12.2 10/18/2021   HCT 36.9 10/18/2021   MCV 85.8 10/18/2021   PLT 227 10/18/2021      Chemistry      Component Value Date/Time   NA 136 10/18/2021 1009   K 3.8 10/18/2021 1009   CL 101 10/18/2021 1009   CO2 28 10/18/2021 1009   BUN 9 10/18/2021 1009   CREATININE 0.77 10/18/2021 1009   CREATININE 0.80 01/02/2019 1329      Component Value Date/Time   CALCIUM 9.9 10/18/2021 1009   ALKPHOS 55 10/18/2021 1009   AST 16 10/18/2021 1009   ALT 11 10/18/2021 1009   BILITOT 0.2 (L) 10/18/2021 1009        RADIOGRAPHIC STUDIES: No results found.   ASSESSMENT AND PLAN: This is a very pleasant 66 years old white female recently diagnosed with hemochromatosis with DNA mutation and C282Y and H63D.  The patient continues to complain of increasing fatigue and weakness as well as intermittent abdominal pain and nausea. Her Jak 2 mutation panel was unremarkable. The patient is currently on phlebotomy to keep her ferritin level less than 50. The patient's last phlebotomy was 6 months ago. Repeat CBC today showed normal hemoglobin and hematocrit but the iron study and ferritin are still pending. I will wait on the ferritin level before proceeding with phlebotomy today. For the swelling of the left lower extremity, I will order Doppler of the left lower extremity to rule out deep venous thrombosis. For the occipital neuralgia, Laurie Finley was advised to reach out to her neurologist for evaluation. I will see her back for follow-up visit in 6 months for evaluation and repeat blood work and phlebotomy if needed. The patient was advised to call immediately if Laurie Finley has any concerning symptoms in the interval. The patient voices understanding of current disease status and treatment options and is in agreement with the current care plan. The total time spent in the appointment was 30 minutes.  All questions were answered. The patient knows to call the clinic with any problems, questions or concerns. We can certainly see the patient much sooner if necessary.  Disclaimer: This note was dictated with voice recognition software. Similar sounding words can inadvertently be transcribed and may not be corrected upon review.

## 2021-10-26 ENCOUNTER — Other Ambulatory Visit: Payer: Self-pay | Admitting: Gastroenterology

## 2021-10-31 HISTORY — PX: SPINE SURGERY: SHX786

## 2021-12-01 ENCOUNTER — Encounter: Payer: Self-pay | Admitting: Gastroenterology

## 2021-12-01 ENCOUNTER — Ambulatory Visit (INDEPENDENT_AMBULATORY_CARE_PROVIDER_SITE_OTHER): Payer: Medicare Other | Admitting: Gastroenterology

## 2021-12-01 VITALS — BP 118/78 | HR 47 | Ht 62.0 in | Wt 210.4 lb

## 2021-12-01 DIAGNOSIS — K5900 Constipation, unspecified: Secondary | ICD-10-CM | POA: Diagnosis not present

## 2021-12-01 DIAGNOSIS — K581 Irritable bowel syndrome with constipation: Secondary | ICD-10-CM | POA: Diagnosis not present

## 2021-12-01 DIAGNOSIS — R1013 Epigastric pain: Secondary | ICD-10-CM | POA: Diagnosis not present

## 2021-12-01 MED ORDER — LINACLOTIDE 72 MCG PO CAPS: 72.0000 ug | ORAL_CAPSULE | Freq: Every day | ORAL | 6 refills | Status: DC

## 2021-12-01 MED ORDER — DICYCLOMINE HCL 10 MG PO CAPS: 10.0000 mg | ORAL_CAPSULE | Freq: Three times a day (TID) | ORAL | 3 refills | Status: DC | PRN

## 2021-12-01 NOTE — Progress Notes (Signed)
Laurie Finley    [id number]    [birth year]  Primary Care Physician:Eason, Renae Fickle, MD  Referring Physician: [name], MD [id number] [place],  [place] [address]   Chief complaint: Constipation, IBS  HPI:  66 year old very pleasant female here for follow-up visit for generalized abdominal pain, constipation and intermittent nausea      She is taking Linzess 72 mcg daily an additional half to 1 capful MiraLAX daily.  On average she is having daily bowel movement but it hurts in the lower abdomen when ever she skips a day and then she has to use the bathroom.  She is passing hard stool occasionally    She is no longer vomiting and does not have nausea every day, uses meclizine as needed for intermittent episodes of nausea.  She continues to have intermittent abdominal discomfort and cramping which improves when she takes dicyclomine but it takes 30 to 40 minutes before it starts working   Diarrhea improved after she completed course of Xifaxan.   Gastric emptying study 10/23/20 Expected location of the stomach in the left upper quadrant. Ingested meal empties the stomach gradually over the course of the study with 11.24% retention at 60 min and 2.55% retention at 120 min (normal retention less than 30% at a 120 min).   TTG Ig Ab negative for celiac disease   History of hemochromatosis, gets periodic phlebotomy for iron chelation.   CT abdomen and pelvis 08/15/2019 No CT findings to account for the patient's chronic abdominal pain. Status post cholecystectomy, appendectomy, and hysterectomy.   Colonoscopy October 03, 2019: For diarrhea evaluation, random colon biopsies negative for microscopic colitis.  Diverticulosis and internal hemorrhoid EGD October 03, 2019: Mild erosive esophagitis secondary to reflux, hiatal hernia, CLOtest negative for H. pylori otherwise normal exam       Outpatient Encounter Medications as of 12/01/2021  Medication Sig   acetaminophen  (TYLENOL) 500 MG tablet Take 500 mg by mouth every 6 (six) hours as needed for mild pain or moderate pain.   albuterol (VENTOLIN HFA) 108 (90 Base) MCG/ACT inhaler Inhale 1 puff into the lungs every 6 (six) hours as needed for wheezing or shortness of breath.   Albuterol Sulfate 108 (90 Base) MCG/ACT AEPB Inhale into the lungs. Nebulizer   amiodarone (PACERONE) 100 MG tablet Take 100 mg by mouth daily.   amitriptyline (ELAVIL) 10 MG tablet Take 10 mg by mouth at bedtime.   arformoterol (BROVANA) 15 MCG/2ML NEBU Inhale 15 mcg into the lungs in the morning and at bedtime.   Butalbital-APAP-Caff-Cod 50-300-40-30 MG CAPS Take by mouth as needed.   Ca Carbonate-Mag Hydroxide (ROLAIDS PO) Take by mouth. Patient states that she uses 2-3 times weekly.   Cyanocobalamin (VITAMIN B-12 IJ) Inject as directed every 21 ( twenty-one) days.    denosumab (PROLIA) 60 MG/ML SOSY injection Inject 60 mg into the skin every 6 (six) months.   diazepam (VALIUM) 2 MG tablet Take 2 mg by mouth. Patient takes as needed for dizziness.   dicyclomine (BENTYL) 10 MG capsule TAKE 1 CAPSULE BY MOUTH EVERY 8 HOURS AS NEEDED FOR SPASMS   diphenhydrAMINE (BENADRYL) 25 MG tablet Take 25 mg by mouth at bedtime.   ergocalciferol (VITAMIN D2) 1.25 MG (50000 UT) capsule Take 50,000 Units by mouth once a week. Patient states that she takes 1 by mouth twice a week.   levothyroxine (SYNTHROID) 25 MCG tablet Take 25 mcg by mouth daily.  linaclotide (LINZESS) 72 MCG capsule Take 1 capsule (72 mcg total) by mouth daily before breakfast.   meclizine (ANTIVERT) 12.5 MG tablet Take 12.5 mg by mouth 3 (three) times daily as needed for dizziness.   metFORMIN (GLUCOPHAGE-XR) 500 MG 24 hr tablet Take 2 tablets by mouth in the morning and at bedtime.   metoprolol tartrate (LOPRESSOR) 50 MG tablet Take 75 mg by mouth 2 (two) times daily.   OXYGEN Inhale 2 L into the lungs continuous. 3 L at night   pantoprazole (PROTONIX) 40 MG tablet TAKE 1 TABLET  BY MOUTH EVERY DAY   pravastatin (PRAVACHOL) 10 MG tablet Take 10 mg by mouth daily.   No facility-administered encounter medications on file as of 12/01/2021.    Allergies as of 12/01/2021 - Review Complete 12/01/2021  Allergen Reaction Noted   Eggs or egg-derived products Anaphylaxis and Other (See Comments) 02/08/2009   Iodinated contrast media Hives 01/02/2019   Lisinopril  01/28/2015   Adhesive [tape]  01/02/2019   Asa [aspirin]  01/02/2019   Asmanex (120 metered doses) [mometasone furoate]  01/02/2019   Biaxin [clarithromycin]  01/02/2019   Erythromycin  01/02/2019   Evista [raloxifene hcl]  01/02/2019   Influenza vaccines  01/02/2019   Nexium [esomeprazole magnesium]  01/02/2019   Norco [hydrocodone-acetaminophen]  01/02/2019   Oxycodone  01/02/2019   Prilosec [omeprazole]  01/02/2019   Qvar [beclomethasone]  01/02/2019   Tessalon [benzonatate]  01/02/2019   Topiramate Itching 06/19/2018    Past Medical History:  Diagnosis Date   Allergic rhinitis from [facility] encounter 06/30/2019   Allergy    SEASONAL   Anxiety disorder    from [facility] encounter 06/30/2019   Arthritis of lumbosacral spine    Asthma    Asthma    from [facility] clinic encounter 06/30/2019   Cataract    BILATERAL-REMOVED   Cervical arthritis    Chest pain    Colitis 11/25/1999   from [facility] encounter 06/30/2019   Colitis due to Clostridium difficile 07/14/2003   from [facility] clinic encounter 06/30/2019   Colon polyp    from [facility] encounter 06/30/2019   Diabetes ([place])    Diarrhea    Dizzinesses    Essential hypertension    GERD (gastroesophageal reflux disease)    H. pylori infection    Hx of labyrinthitis 08/14/2006   from [facility] clinic encounter 06/30/2019   Hyperlipemia    Hypokalemia 08/04/2003   from [facility] clinic encounter 06/30/2019   Hypokalemia 08/04/2003   Hypothyroid    Menopausal syndrome    from [facility] clinic encounter 06/30/2019   Neck pain     from [facility] clinic encounter 06/30/2019   Osteoporosis    Palpitations    Preoperative cardiovascular examination    SNHL (sensorineural hearing loss)    from [facility] encounter 06/30/2019   Stroke [facility])    Unsteady gait    from [facility] clinic encounter 06/30/2019    Past Surgical History:  Procedure Laterality Date   ABDOMINAL HYSTERECTOMY     ALLOGRAFT SPINE MORSELIZED Bilateral 1226/2017   from [facility] clinic encounter 06/30/2019, DrDavid Prior   APPENDECTOMY     CATARACT EXTRACTION Bilateral    CHOLECYSTECTOMY     COLONOSCOPY     LAPAROTOMY     with mesh (per pt, bowel tissue adhered due to endometriosis)   SPINE SURGERY  10/31/2021   TONSILLECTOMY     UPPER GASTROINTESTINAL ENDOSCOPY      Family History  Problem Relation Age  of Onset   Osteoporosis Mother    Stroke Mother    Pancreatic cancer Mother    Hypertension Father    Epilepsy Sister    Hypertension Sister    Liver cancer Sister    Diabetes Sister    Hypertension Sister    Healthy Sister    Hypertension Sister    Hypertension Sister    Hypertension Sister    Lung disease Sister    Hypertension Sister    Cirrhosis Sister    Hepatitis C Sister    Epilepsy Brother    Colon cancer Brother    Hypertension Brother    Other Brother        Brain bleed   Leukemia Brother    Stomach cancer Paternal Aunt    Celiac disease Nephew    Celiac disease Niece    Esophageal cancer Neg Hx    Rectal cancer Neg Hx     Social History   Socioeconomic History   Marital status: Married    Spouse name: Tommy   Number of children: 0   Years of education: Not on file   Highest education level: Not on file  Occupational History   Occupation: retired  Tobacco Use   Smoking status: Never   Smokeless tobacco: Never  Vaping Use   Vaping Use: Never used  Substance and Sexual Activity   Alcohol use: Never   Drug use: Never   Sexual activity: Yes    Partners: Male    Birth control/protection: Surgical  Other  Topics Concern   Not on file  Social History Narrative   Not on file   Social Determinants of Health   Financial Resource Strain: Not on file  Food Insecurity: Not on file  Transportation Needs: Not on file  Physical Activity: Not on file  Stress: Not on file  Social Connections: Not on file  Intimate Partner Violence: Not on file      Review of systems: All other review of systems negative except as mentioned in the HPI.   Physical Exam: Vitals:   12/01/21 1036  BP: 118/78  Pulse: (Abnormal) 47  SpO2: 98%   Body mass index is 38.48 kg/m. Gen:      No acute distress HEENT:  sclera anicteric Abd:      soft, non-tender; no palpable masses, no distension Ext:    No edema Neuro: alert and oriented x 3 Psych: normal mood and affect  Data Reviewed:  Reviewed labs, radiology imaging, old records and pertinent past GI work up   Assessment and Plan/Recommendations:  66 year old very pleasant female with history of endometriosis, ex lap, TAH, BSO, appendectomy and cholecystectomy with extensive intra-abdominal and pelvic adhesions with irritable bowel syndrome with predominant constipation   Nausea and vomiting: Multifactorial, IBS constipation and rapid gastric emptying likely contributing to her symptoms Improved with small frequent meals with high-protein diet Avoid sugar or high carb diet Trial of FDgard 1 capsule up to 3 times daily as needed for dyspepsia   Intermittent lower abdominal cramping and discomfort secondary to IBS constipation IBS predominant constipation Continue Linzess and increase water intake Add MiraLAX half to 1 capful daily and titrate based on response to improve bowel habits IBgard 1 capsule up to 3 times daily as needed for abdominal cramping and IBS symptoms   Hemochromatosis and fatty liver: Normal LFT.  She gets intermittent phlebotomy through hematology   Intermittent abdominal cramping: Use dicyclomine 10 mg every 8 hours as  needed  GERD: Continue Protonix and antireflux measures   Return in 3 to 4 months or sooner if needed  This visit required 40 minutes of patient care (this includes precharting, chart review, review of results, face-to-face time used for counseling as well as treatment plan and follow-up. The patient was provided an opportunity to ask questions and all were answered. The patient agreed with the plan and demonstrated an understanding of the instructions.  Iona BeardK. Veena Caleah Tortorelli , MD    CC: Kathlee NationsEason, Paul, MD

## 2021-12-01 NOTE — Patient Instructions (Addendum)
We have sent the following medications to your pharmacy for you to pick up at your convenience: Dicyclomine and Linzess  Take 1 capsule FDgard three times daily as needed.  _______________________________________________________  If you are age 67 or older, your body mass index should be between 23-30. Your Body mass index is 38.48 kg/m. If this is out of the aforementioned range listed, please consider follow up with your Primary Care Provider.  If you are age 39 or younger, your body mass index should be between 19-25. Your Body mass index is 38.48 kg/m. If this is out of the aformentioned range listed, please consider follow up with your Primary Care Provider.   ________________________________________________________  The [facility] providers would like to encourage you to use [facility] to communicate with providers for non-urgent requests or questions.  Due to long hold times on the telephone, sending your provider a message by [facility] may be a faster and more efficient way to get a response.  Please allow 48 business hours for a response.  Please remember that this is for non-urgent requests.  _______________________________________________________

## 2021-12-05 ENCOUNTER — Encounter: Payer: Self-pay | Admitting: Gastroenterology

## 2021-12-19 ENCOUNTER — Other Ambulatory Visit: Payer: Self-pay | Admitting: Gastroenterology

## 2022-01-16 ENCOUNTER — Telehealth: Payer: Self-pay | Admitting: Gastroenterology

## 2022-01-16 MED ORDER — LINACLOTIDE 72 MCG PO CAPS: 72.0000 ug | ORAL_CAPSULE | Freq: Every day | ORAL | 2 refills | Status: DC

## 2022-01-16 NOTE — Telephone Encounter (Signed)
Patient called states she needs refill on lizess 72 mcg. States her pharmacy run out of refill. Patient has an appt on 1-9.

## 2022-01-16 NOTE — Telephone Encounter (Signed)
Refilled [employer] Linzess 10mcg to cover until January appointment.

## 2022-01-17 MED ORDER — LINACLOTIDE 72 MCG PO CAPS: 72.0000 ug | ORAL_CAPSULE | Freq: Every day | ORAL | 0 refills | Status: DC

## 2022-01-17 NOTE — Telephone Encounter (Signed)
Patient called regarding her Linzess being to expensive at the [facility] and requests it to be sent to [facility].

## 2022-01-17 NOTE — Telephone Encounter (Addendum)
After a 28 minute hold I was able to speak to a customer rep about getting her Linzess refilled. I have to have Dr Silverio Decamp hand sign a rx and fax it to them at fax # [phone]. Their phone # is [phone]. The rx sent to [facility] has been cancelled.  Her assistance is good thru the end of the year per Butch Penny.

## 2022-01-17 NOTE — Addendum Note (Signed)
Addended by: [place], Allyssa Abruzzese E on: 01/17/2022 04:27 PM   Modules accepted: Orders

## 2022-01-18 NOTE — Telephone Encounter (Signed)
Laurie Finley's Linzess rx has been faxed to [phone] and we got confirmation it went thru.

## 2022-01-24 ENCOUNTER — Encounter (INDEPENDENT_AMBULATORY_CARE_PROVIDER_SITE_OTHER): Payer: Self-pay | Admitting: Gastroenterology

## 2022-03-09 ENCOUNTER — Other Ambulatory Visit: Payer: Self-pay | Admitting: Gastroenterology

## 2022-03-21 ENCOUNTER — Ambulatory Visit: Payer: Medicare Other | Admitting: Gastroenterology

## 2022-03-24 ENCOUNTER — Encounter: Payer: Self-pay | Admitting: Gastroenterology

## 2022-03-24 ENCOUNTER — Ambulatory Visit (INDEPENDENT_AMBULATORY_CARE_PROVIDER_SITE_OTHER): Payer: Medicare Other | Admitting: Gastroenterology

## 2022-03-24 VITALS — BP 110/60 | HR 52 | Ht 62.0 in | Wt 213.0 lb

## 2022-03-24 DIAGNOSIS — R109 Unspecified abdominal pain: Secondary | ICD-10-CM

## 2022-03-24 DIAGNOSIS — K581 Irritable bowel syndrome with constipation: Secondary | ICD-10-CM | POA: Diagnosis not present

## 2022-03-24 DIAGNOSIS — K219 Gastro-esophageal reflux disease without esophagitis: Secondary | ICD-10-CM

## 2022-03-24 DIAGNOSIS — R112 Nausea with vomiting, unspecified: Secondary | ICD-10-CM

## 2022-03-24 MED ORDER — LINACLOTIDE 145 MCG PO CAPS: 145.0000 ug | ORAL_CAPSULE | Freq: Every day | ORAL | 1 refills | Status: DC

## 2022-03-24 NOTE — Progress Notes (Signed)
Laurie Finley    [id number]    09/29/1955  Primary Care Physician:Eason, Eddie Dibbles, MD  Referring Physician: [name], [place] [address]   Chief complaint:  IBS-constipation  HPI:  67 year old very pleasant female here for follow-up visit for generalized abdominal pain, constipation and intermittent nausea      She is taking Linzess 72 mcg daily an additional half to 1 capful MiraLAX daily.  She continues to struggle with constipation.  She did not tolerate higher dose of Linzess.  On average she is having daily bowel movement but it hurts in the lower abdomen when ever she skips a day and then she has to use the bathroom.  She is passing hard stool occasionally    She is no longer vomiting and does not have nausea every day, uses meclizine as needed for intermittent episodes of nausea.  She continues to have intermittent abdominal discomfort and cramping which improves when she takes dicyclomine but it takes 30 to 40 minutes before it starts working   Diarrhea improved after she completed course of Xifaxan.   Gastric emptying study 10/23/20 Expected location of the stomach in the left upper quadrant. Ingested meal empties the stomach gradually over the course of the study with 11.24% retention at 60 min and 2.55% retention at 120 min (normal retention less than 30% at a 120 min).   TTG Ig Ab negative for celiac disease    History of hemochromatosis, gets periodic phlebotomy for iron chelation.   CT abdomen and pelvis 08/15/2019 No CT findings to account for the patient's chronic abdominal pain. Status post cholecystectomy, appendectomy, and hysterectomy.   Colonoscopy October 03, 2019: For diarrhea evaluation, random colon biopsies negative for microscopic colitis.  Diverticulosis and internal hemorrhoid EGD October 03, 2019: Mild erosive esophagitis secondary to reflux, hiatal hernia, CLOtest negative for H. pylori otherwise normal exam        Outpatient Encounter Medications as of 03/24/2022  Medication Sig   acetaminophen (TYLENOL) 500 MG tablet Take 500 mg by mouth every 6 (six) hours as needed for mild pain or moderate pain.   albuterol (VENTOLIN HFA) 108 (90 Base) MCG/ACT inhaler Inhale 1 puff into the lungs every 6 (six) hours as needed for wheezing or shortness of breath.   Albuterol Sulfate 108 (90 Base) MCG/ACT AEPB Inhale into the lungs. Nebulizer   amiodarone (PACERONE) 100 MG tablet Take 100 mg by mouth daily.   amitriptyline (ELAVIL) 10 MG tablet Take 10 mg by mouth at bedtime.   arformoterol (BROVANA) 15 MCG/2ML NEBU Inhale 15 mcg into the lungs in the morning and at bedtime.   Butalbital-APAP-Caff-Cod 50-300-40-30 MG CAPS Take by mouth as needed.   Ca Carbonate-Mag Hydroxide (ROLAIDS PO) Take by mouth. Patient states that she uses 2-3 times weekly.   Cyanocobalamin (VITAMIN B-12 IJ) Inject as directed every 21 ( twenty-one) days.    denosumab (PROLIA) 60 MG/ML SOSY injection Inject 60 mg into the skin every 6 (six) months.   diazepam (VALIUM) 2 MG tablet Take 2 mg by mouth. Patient takes as needed for dizziness.   dicyclomine (BENTYL) 10 MG capsule TAKE 1 CAPSULE (10 MG TOTAL) BY MOUTH EVERY 8 (EIGHT) HOURS AS NEEDED FOR SPASMS.   diphenhydrAMINE (BENADRYL) 25 MG tablet Take 25 mg by mouth at bedtime.   ergocalciferol (VITAMIN D2) 1.25 MG (50000 UT) capsule Take 50,000 Units by mouth once a week. Patient states that she takes  1 by mouth twice a week.   furosemide (LASIX) 20 MG tablet    levothyroxine (SYNTHROID) 25 MCG tablet Take 25 mcg by mouth daily.   linaclotide (LINZESS) 72 MCG capsule Take 1 capsule (72 mcg total) by mouth daily before breakfast.   meclizine (ANTIVERT) 12.5 MG tablet Take 12.5 mg by mouth 3 (three) times daily as needed for dizziness.   metFORMIN (GLUCOPHAGE-XR) 500 MG 24 hr tablet Take 2 tablets by mouth in the morning and at bedtime.   metoprolol tartrate (LOPRESSOR) 50 MG tablet Take 75  mg by mouth 2 (two) times daily.   OXYGEN Inhale 2 L into the lungs continuous. 3 L at night   pantoprazole (PROTONIX) 40 MG tablet TAKE 1 TABLET BY MOUTH EVERY DAY   pravastatin (PRAVACHOL) 10 MG tablet Take 10 mg by mouth daily.   No facility-administered encounter medications on file as of 03/24/2022.    Allergies as of 03/24/2022 - Review Complete 03/24/2022  Allergen Reaction Noted   Eggs or egg-derived products Anaphylaxis and Other (See Comments) 02/08/2009   Iodinated contrast media Hives 01/02/2019   Lisinopril  01/28/2015   Adhesive [tape]  01/02/2019   Asa [aspirin]  01/02/2019   Asmanex (120 metered doses) [mometasone furoate]  01/02/2019   Biaxin [clarithromycin]  01/02/2019   Erythromycin  01/02/2019   Evista [raloxifene hcl]  01/02/2019   Influenza vaccines  01/02/2019   Nexium [esomeprazole magnesium]  01/02/2019   Norco [hydrocodone-acetaminophen]  01/02/2019   Oxycodone  01/02/2019   Prilosec [omeprazole]  01/02/2019   Qvar [beclomethasone]  01/02/2019   Tessalon [benzonatate]  01/02/2019   Topiramate Itching 06/19/2018    Past Medical History:  Diagnosis Date   Allergic rhinitis from [place] clinic encounter 06/30/2019   Allergy    SEASONAL   Anxiety disorder    from [facility] encounter 06/30/2019   Arthritis of lumbosacral spine    Asthma    Asthma    from [place] [facility] encounter 06/30/2019   Cataract    BILATERAL-REMOVED   Cervical arthritis    Chest pain    Colitis 11/25/1999   from [place] [facility] encounter 06/30/2019   Colitis due to Clostridium difficile 07/14/2003   from [place] clinic encounter 06/30/2019   Colon polyp    from [place] clinic encounter 06/30/2019   Diabetes ([place])    Diarrhea    Dizzinesses    Essential hypertension    GERD (gastroesophageal reflux disease)    H. pylori infection    Hx of labyrinthitis 08/14/2006   from [place] clinic encounter 06/30/2019   Hyperlipemia    Hypokalemia 08/04/2003   from [place] clinic  encounter 06/30/2019   Hypokalemia 08/04/2003   Hypothyroid    Menopausal syndrome    from [place] clinic encounter 06/30/2019   Neck pain    from [place] clinic encounter 06/30/2019   Osteoporosis    Palpitations    Preoperative cardiovascular examination    SNHL (sensorineural hearing loss)    from [place] clinic encounter 06/30/2019   Stroke [facility])    Unsteady gait    from [place] clinic encounter 06/30/2019    Past Surgical History:  Procedure Laterality Date   ABDOMINAL HYSTERECTOMY     ALLOGRAFT SPINE MORSELIZED Bilateral 1226/2017   from [place] clinic encounter 06/30/2019, DrDavid Prior   APPENDECTOMY     CATARACT EXTRACTION Bilateral    CHOLECYSTECTOMY     COLONOSCOPY     LAPAROTOMY     with mesh (per pt, bowel tissue adhered due  to endometriosis)   SPINE SURGERY  10/31/2021   TONSILLECTOMY     UPPER GASTROINTESTINAL ENDOSCOPY      Family History  Problem Relation Age of Onset   Osteoporosis Mother    Stroke Mother    Pancreatic cancer Mother    Hypertension Father    Epilepsy Sister    Hypertension Sister    Liver cancer Sister    Diabetes Sister    Hypertension Sister    Healthy Sister    Hypertension Sister    Hypertension Sister    Hypertension Sister    Lung disease Sister    Hypertension Sister    Cirrhosis Sister    Hepatitis C Sister    Epilepsy Brother    Colon cancer Brother    Hypertension Brother    Other Brother        Brain bleed   Leukemia Brother    Stomach cancer Paternal Aunt    Celiac disease Nephew    Celiac disease Niece    Esophageal cancer Neg Hx    Rectal cancer Neg Hx     Social History   Socioeconomic History   Marital status: Married    Spouse name: Tommy   Number of children: 0   Years of education: Not on file   Highest education level: Not on file  Occupational History   Occupation: retired  Tobacco Use   Smoking status: Never   Smokeless tobacco: Never  Vaping Use   Vaping Use: Never used  Substance and  Sexual Activity   Alcohol use: Never   Drug use: Never   Sexual activity: Yes    Partners: Male    Birth control/protection: Surgical  Other Topics Concern   Not on file  Social History Narrative   Not on file   Social Determinants of Health   Financial Resource Strain: Not on file  Food Insecurity: Not on file  Transportation Needs: Not on file  Physical Activity: Not on file  Stress: Not on file  Social Connections: Not on file  Intimate Partner Violence: Not on file      Review of systems: All other review of systems negative except as mentioned in the HPI.   Physical Exam: Vitals:   03/24/22 1434  BP: 110/60  Pulse: (Abnormal) 52   Body mass index is 38.96 kg/m. Gen:      No acute distress HEENT:  sclera anicteric Abd:      soft, non-tender; no palpable masses, no distension Ext:    No edema Neuro: alert and oriented x 3 Psych: normal mood and affect  Data Reviewed:  Reviewed labs, radiology imaging, old records and pertinent past GI work up   Assessment and Plan/Recommendations:  67 year old very pleasant female with history of endometriosis, ex lap, TAH, BSO, appendectomy and cholecystectomy with extensive intra-abdominal and pelvic adhesions with irritable bowel syndrome with predominant constipation   Nausea and vomiting: Multifactorial, IBS constipation and rapid gastric emptying likely contributing to her symptoms Improved with small frequent meals with high-protein diet Avoid sugar or high carb diet Trial of FDgard 1 capsule up to 3 times daily as needed for dyspepsia   Intermittent lower abdominal cramping and discomfort secondary to IBS constipation IBS predominant constipation Will do a trial of Linzess 145 mcg daily, if develops severe diarrhea advised patient to stop and go back to lower dose 72 mcg daily of Linzess    Hemochromatosis and fatty liver: Normal LFT.  She gets intermittent phlebotomy through hematology  Intermittent  abdominal cramping: Use dicyclomine 10 mg every 8 hours as needed   GERD: Continue Protonix and antireflux measures   Return in 3 to 4 months or sooner if needed   The patient was provided an opportunity to ask questions and all were answered. The patient agreed with the plan and demonstrated an understanding of the instructions.  Iona Beard , MD    CC: [name], MD

## 2022-03-24 NOTE — Patient Instructions (Signed)
We have sent the following medications to your pharmacy for you to pick up at your convenience: Linzess 145 mcg daily.   The [facility] providers would like to encourage you to use [facility] to communicate with providers for non-urgent requests or questions.  Due to long hold times on the telephone, sending your provider a message by [facility] may be a faster and more efficient way to get a response.  Please allow 48 business hours for a response.  Please remember that this is for non-urgent requests.

## 2022-03-28 ENCOUNTER — Telehealth: Payer: Self-pay | Admitting: Gastroenterology

## 2022-03-28 NOTE — Telephone Encounter (Signed)
Patient called requesting to speak with you regarding her [employer] application.

## 2022-03-29 ENCOUNTER — Encounter: Payer: Self-pay | Admitting: Gastroenterology

## 2022-03-29 NOTE — Telephone Encounter (Signed)
Returned patients call, left message to call back about her [employer] application

## 2022-04-20 ENCOUNTER — Inpatient Hospital Stay (HOSPITAL_BASED_OUTPATIENT_CLINIC_OR_DEPARTMENT_OTHER): Payer: Medicare Other | Admitting: Internal Medicine

## 2022-04-20 ENCOUNTER — Inpatient Hospital Stay: Payer: Medicare Other | Attending: Internal Medicine

## 2022-04-20 ENCOUNTER — Inpatient Hospital Stay: Payer: Medicare Other

## 2022-04-20 DIAGNOSIS — J449 Chronic obstructive pulmonary disease, unspecified: Secondary | ICD-10-CM | POA: Insufficient documentation

## 2022-04-20 DIAGNOSIS — D751 Secondary polycythemia: Secondary | ICD-10-CM

## 2022-04-20 LAB — CMP (CANCER CENTER ONLY)
ALT: 7 U/L (ref 0–44)
AST: 17 U/L (ref 15–41)
Albumin: 4 g/dL (ref 3.5–5.0)
Alkaline Phosphatase: 61 U/L (ref 38–126)
Anion gap: 8 (ref 5–15)
BUN: 11 mg/dL (ref 8–23)
CO2: 24 mmol/L (ref 22–32)
Calcium: 9.7 mg/dL (ref 8.9–10.3)
Chloride: 104 mmol/L (ref 98–111)
Creatinine: 0.95 mg/dL (ref 0.44–1.00)
GFR, Estimated: >60 mL/min (ref >60)
Glucose, Bld: 79 mg/dL (ref 70–99)
Potassium: 4.1 mmol/L (ref 3.5–5.1)
Sodium: 136 mmol/L (ref 135–145)
Total Bilirubin: 0.3 mg/dL (ref 0.3–1.2)
Total Protein: 6.8 g/dL (ref 6.5–8.1)

## 2022-04-20 LAB — CBC WITH DIFFERENTIAL (CANCER CENTER ONLY)
Abs Immature Granulocytes: 0.03 10*3/uL (ref 0.00–0.07)
Basophils Absolute: 0.1 10*3/uL (ref 0.0–0.1)
Basophils Relative: 1 %
Eosinophils Absolute: 0.3 10*3/uL (ref 0.0–0.5)
Eosinophils Relative: 4 %
HCT: 39.5 % (ref 36.0–46.0)
Hemoglobin: 13.1 g/dL (ref 12.0–15.0)
Immature Granulocytes: 0 %
Lymphocytes Relative: 25 %
Lymphs Abs: 2.1 10*3/uL (ref 0.7–4.0)
MCH: 28.7 pg (ref 26.0–34.0)
MCHC: 33.2 g/dL (ref 30.0–36.0)
MCV: 86.4 fL (ref 80.0–100.0)
Monocytes Absolute: 0.8 10*3/uL (ref 0.1–1.0)
Monocytes Relative: 9 %
Neutro Abs: 5.2 10*3/uL (ref 1.7–7.7)
Neutrophils Relative %: 61 %
Platelet Count: 241 10*3/uL (ref 150–400)
RBC: 4.57 MIL/uL (ref 3.87–5.11)
RDW: 13.2 % (ref 11.5–15.5)
WBC Count: 8.5 10*3/uL (ref 4.0–10.5)
nRBC: 0 % (ref 0.0–0.2)

## 2022-04-20 LAB — IRON AND IRON BINDING CAPACITY (CC-WL,HP ONLY)
Iron: 105 ug/dL (ref 28–170)
Saturation Ratios: 25 % (ref 10.4–31.8)
TIBC: 419 ug/dL (ref 250–450)
UIBC: 314 ug/dL (ref 148–442)

## 2022-04-20 LAB — FERRITIN: Ferritin: 27 ng/mL (ref 11–307)

## 2022-04-20 NOTE — Progress Notes (Signed)
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  She has negative JAK2 mutation panel.  PRIOR THERAPY: None  CURRENT THERAPY: Phlebotomy on as-needed basis.  INTERVAL HISTORY: Laurie Finley 67 y.o. female returns to clinic today for follow-up visit accompanied by her husband.  The patient is feeling fine today with no concerning complaints except for the baseline shortness of breath increased with exertion.  She is currently on home oxygen.  She denied having any chest pain, cough or hemoptysis.  She has no nausea, vomiting, diarrhea or constipation.  She has no headache or visual changes.  She has no recent weight loss or night sweats.  She is here today for evaluation and repeat CBC, iron study and ferritin for evaluation of her condition.  MEDICAL HISTORY: Past Medical History:  Diagnosis Date   Allergic rhinitis from [place] [facility] encounter 06/30/2019   Allergy    SEASONAL   Anxiety disorder    from [facility] encounter 06/30/2019   Arthritis of lumbosacral spine    Asthma    Asthma    from [place] clinic encounter 06/30/2019   Cataract    BILATERAL-REMOVED   Cervical arthritis    Chest pain    Colitis 11/25/1999   from [place] clinic encounter 06/30/2019   Colitis due to Clostridium difficile 07/14/2003   from [place] clinic encounter 06/30/2019   Colon polyp    from [place] clinic encounter 06/30/2019   Diabetes ([place])    Diarrhea    Dizzinesses    Essential hypertension    GERD (gastroesophageal reflux disease)    H. pylori infection    Hx of labyrinthitis 08/14/2006   from [place] clinic encounter 06/30/2019   Hyperlipemia    Hypokalemia 08/04/2003   from [place] clinic encounter 06/30/2019   Hypokalemia  08/04/2003   Hypothyroid    Menopausal syndrome    from [place] clinic encounter 06/30/2019   Neck pain    from [place] clinic encounter 06/30/2019   Osteoporosis    Palpitations    Preoperative cardiovascular examination    SNHL (sensorineural hearing loss)    from [place] clinic encounter 06/30/2019   Stroke [facility])    Unsteady gait    from [place] clinic encounter 06/30/2019    ALLERGIES:  is allergic to eggs or egg-derived products, iodinated contrast media, lisinopril, adhesive [tape], asa [aspirin], asmanex (120 metered doses) [mometasone furoate], biaxin [clarithromycin], erythromycin, evista [raloxifene hcl], influenza vaccines, nexium [esomeprazole magnesium], norco [hydrocodone-acetaminophen], oxycodone, prilosec [omeprazole], qvar [beclomethasone], tessalon [benzonatate], and topiramate.  MEDICATIONS:  Current Outpatient Medications  Medication Sig Dispense Refill   acetaminophen (TYLENOL) 500 MG tablet Take 500 mg by mouth every 6 (six) hours as needed for mild pain or moderate pain.     albuterol (VENTOLIN HFA) 108 (90 Base) MCG/ACT inhaler Inhale 1 puff into the lungs every 6 (six) hours as needed for wheezing or shortness of breath.     Albuterol Sulfate 108 (90 Base) MCG/ACT AEPB Inhale into the lungs. Nebulizer     amiodarone (PACERONE) 100 MG tablet Take 100 mg by mouth daily.     amitriptyline (ELAVIL) 10 MG tablet Take 10 mg by mouth at bedtime.     arformoterol (BROVANA) 15 MCG/2ML NEBU Inhale 15 mcg into the lungs in the morning and  at bedtime.     Butalbital-APAP-Caff-Cod 50-300-40-30 MG CAPS Take by mouth as needed.     Ca Carbonate-Mag Hydroxide (ROLAIDS PO) Take by mouth. Patient states that she uses 2-3 times weekly.     Cyanocobalamin (VITAMIN B-12 IJ) Inject as directed every 21 ( twenty-one) days.      denosumab (PROLIA) 60 MG/ML SOSY injection Inject 60 mg into the skin every 6 (six) months.     diazepam (VALIUM) 2 MG tablet Take 2 mg by mouth. Patient takes as  needed for dizziness.     dicyclomine (BENTYL) 10 MG capsule TAKE 1 CAPSULE (10 MG TOTAL) BY MOUTH EVERY 8 (EIGHT) HOURS AS NEEDED FOR SPASMS. 270 capsule 1   diphenhydrAMINE (BENADRYL) 25 MG tablet Take 25 mg by mouth at bedtime.     ergocalciferol (VITAMIN D2) 1.25 MG (50000 UT) capsule Take 50,000 Units by mouth once a week. Patient states that she takes 1 by mouth twice a week.     furosemide (LASIX) 20 MG tablet      levothyroxine (SYNTHROID) 25 MCG tablet Take 25 mcg by mouth daily.     linaclotide (LINZESS) 145 MCG CAPS capsule Take 1 capsule (145 mcg total) by mouth daily before breakfast. 90 capsule 1   meclizine (ANTIVERT) 12.5 MG tablet Take 12.5 mg by mouth 3 (three) times daily as needed for dizziness.     metFORMIN (GLUCOPHAGE-XR) 500 MG 24 hr tablet Take 2 tablets by mouth in the morning and at bedtime.     metoprolol tartrate (LOPRESSOR) 50 MG tablet Take 75 mg by mouth 2 (two) times daily.     OXYGEN Inhale 2 L into the lungs continuous. 3 L at night     pantoprazole (PROTONIX) 40 MG tablet TAKE 1 TABLET BY MOUTH EVERY DAY 90 tablet 3   pravastatin (PRAVACHOL) 10 MG tablet Take 10 mg by mouth daily.     No current facility-administered medications for this visit.    SURGICAL HISTORY:  Past Surgical History:  Procedure Laterality Date   ABDOMINAL HYSTERECTOMY     ALLOGRAFT SPINE MORSELIZED Bilateral 1226/2017   from [place] clinic encounter 06/30/2019, DrDavid Prior   APPENDECTOMY     CATARACT EXTRACTION Bilateral    CHOLECYSTECTOMY     COLONOSCOPY     LAPAROTOMY     with mesh (per pt, bowel tissue adhered due to endometriosis)   SPINE SURGERY  10/31/2021   TONSILLECTOMY     UPPER GASTROINTESTINAL ENDOSCOPY      REVIEW OF SYSTEMS:  A comprehensive review of systems was negative except for: Constitutional: positive for fatigue Respiratory: positive for dyspnea on exertion   PHYSICAL EXAMINATION: General appearance: alert, cooperative, fatigued, and no  distress Head: Normocephalic, without obvious abnormality, atraumatic Neck: no adenopathy, no JVD, supple, symmetrical, trachea midline, and thyroid not enlarged, symmetric, no tenderness/mass/nodules Lymph nodes: Cervical, supraclavicular, and axillary nodes normal. Resp: clear to auscultation bilaterally Back: symmetric, no curvature. ROM normal. No CVA tenderness. Cardio: regular rate and rhythm, S1, S2 normal, no murmur, click, rub or gallop GI: soft, non-tender; bowel sounds normal; no masses,  no organomegaly Extremities: extremities normal, atraumatic, no cyanosis or edema  ECOG PERFORMANCE STATUS: 1 - Symptomatic but completely ambulatory  Blood pressure (!) 136/47, pulse (!) 50, temperature 97.6 F (36.4 C), temperature source Oral, resp. rate 15, weight 209 lb 14.4 oz (95.2 kg), SpO2 100 %.  LABORATORY DATA: Lab Results  Component Value Date   WBC 8.9 10/18/2021   HGB 12.2 10/18/2021  HCT 36.9 10/18/2021   MCV 85.8 10/18/2021   PLT 227 10/18/2021      Chemistry      Component Value Date/Time   NA 136 10/18/2021 1009   K 3.8 10/18/2021 1009   CL 101 10/18/2021 1009   CO2 28 10/18/2021 1009   BUN 9 10/18/2021 1009   CREATININE 0.77 10/18/2021 1009   CREATININE 0.80 01/02/2019 1329      Component Value Date/Time   CALCIUM 9.9 10/18/2021 1009   ALKPHOS 55 10/18/2021 1009   AST 16 10/18/2021 1009   ALT 11 10/18/2021 1009   BILITOT 0.2 (L) 10/18/2021 1009       RADIOGRAPHIC STUDIES: No results found.   ASSESSMENT AND PLAN: This is a very pleasant 67 years old white female recently diagnosed with hemochromatosis with DNA mutation and C282Y and H63D.  The patient continues to complain of increasing fatigue and weakness as well as intermittent abdominal pain and nausea. Her Jak 2 mutation panel was unremarkable. The patient is currently on phlebotomy to keep her ferritin level less than 50. The patient is feeling fine today with no concerning complaints except  for the baseline shortness of breath secondary to COPD. Repeat CBC today is unremarkable for any abnormality but the iron study and ferritin are still pending. I recommended for the patient to continue on observation with repeat blood work in 6 months unless her ferritin level is high, I will arrange for the patient to receive phlebotomy in the interval. She was advised to call immediately if she has any other concerning symptoms in the interval. The patient voices understanding of current disease status and treatment options and is in agreement with the current care plan. The total time spent in the appointment was 20 minutes.  All questions were answered. The patient knows to call the clinic with any problems, questions or concerns. We can certainly see the patient much sooner if necessary.  Disclaimer: This note was dictated with voice recognition software. Similar sounding words can inadvertently be transcribed and may not be corrected upon review.

## 2022-04-21 NOTE — Telephone Encounter (Signed)
Patient brought application physician signature part to our office for Dr. Silverio Decamp to sign and fax back to [place]. Dr. Silverio Decamp signed and faxed application to number provided on form. Informed patient that we have faxed part of the application but we need the rest. Patient states she has already sent the other part of the application and was told she was approved.

## 2022-04-26 NOTE — Telephone Encounter (Signed)
Patient is calling states [employer] needs Dr Woodward Ku updated license number and expiration date. Please advise

## 2022-04-27 NOTE — Telephone Encounter (Signed)
Re-faxed patient assistance form with state license number and expiration.

## 2022-08-22 NOTE — Progress Notes (Signed)
Laurie Finley    [id number]    08/24/1955  Primary Care Physician:Eason, Renae Fickle, MD  Referring Physician: [name], MD [address] [place],  [place] [address]   Chief complaint:  IBS-constipation Chief Complaint  Patient presents with   Irritable Bowel Syndrome    Pt states she has pain every day along with nausea. She states she takes t the medicine and just gos on.   HPI: 67 year old very pleasant female here for follow-up visit for generalized abdominal pain, constipation and intermittent nausea. She is taking Linzess 72 mcg daily an additional half to 1 capful MiraLAX daily.     I last saw her on 03-24-22. At that time, she complained of constipation, she was not able to tolerate higher dose of linzess. Her symptoms of vomiting had resolved. She continued to have intermittent abdominal discomfort and cramping which improves when she takes dicyclomine but it takes 30 to 40 minutes before it starts working   She is accompanied by her husband.  Today, she complains of generalized abdominal lower abdominal pain that she typically experiences before having a BM. She reports using a heating pad and a muscle relaxer to help relief her symptoms without much relief. She also reports accompanying nausea, she would have two bites of her meal then be unable to finish it. She reports taking linzess 145 mcg daily which allows her to have a BM almost daily and states that she experiences the pain prior and when taking Linzess.     She denies trying Gabapentin or Lyrica.    she denies diarrhea, constipation, blood in stool, black stool, vomiting, bloating, unintentional weight loss, reflux, dysphagia.  GI Hx: Gastric emptying study 10/23/20 Expected location of the stomach in the left upper quadrant. Ingested meal empties the stomach gradually over the course of the study with 11.24% retention at 60 min and 2.55% retention at 120 min (normal retention less than 30% at a 120  min).   TTG Ig Ab negative for celiac disease    History of hemochromatosis, gets periodic phlebotomy for iron chelation.   CT abdomen and pelvis 08/15/2019 No CT findings to account for the patient's chronic abdominal pain. Status post cholecystectomy, appendectomy, and hysterectomy.   Colonoscopy October 03, 2019: For diarrhea evaluation, random colon biopsies negative for microscopic colitis.  Diverticulosis and internal hemorrhoid EGD October 03, 2019: Mild erosive esophagitis secondary to reflux, hiatal hernia, CLOtest negative for H. pylori otherwise normal exam       Current Outpatient Medications:    acetaminophen (TYLENOL) 500 MG tablet, Take 500 mg by mouth every 6 (six) hours as needed for mild pain or moderate pain., Disp: , Rfl:    albuterol (VENTOLIN HFA) 108 (90 Base) MCG/ACT inhaler, Inhale 1 puff into the lungs every 6 (six) hours as needed for wheezing or shortness of breath., Disp: , Rfl:    Albuterol Sulfate 108 (90 Base) MCG/ACT AEPB, Inhale into the lungs. Nebulizer, Disp: , Rfl:    amiodarone (PACERONE) 100 MG tablet, Take 100 mg by mouth daily., Disp: , Rfl:    amitriptyline (ELAVIL) 10 MG tablet, Take 10 mg by mouth at bedtime., Disp: , Rfl:    arformoterol (BROVANA) 15 MCG/2ML NEBU, Inhale 15 mcg into the lungs in the morning and at bedtime., Disp: , Rfl:    Butalbital-APAP-Caff-Cod 50-300-40-30 MG CAPS, Take by mouth as needed., Disp: , Rfl:    Ca Carbonate-Mag Hydroxide (ROLAIDS PO), Take by mouth.  Patient states that she uses 2-3 times weekly., Disp: , Rfl:    Cyanocobalamin (VITAMIN B-12 IJ), Inject as directed every 21 ( twenty-one) days. , Disp: , Rfl:    denosumab (PROLIA) 60 MG/ML SOSY injection, Inject 60 mg into the skin every 6 (six) months., Disp: , Rfl:    diazepam (VALIUM) 2 MG tablet, Take 2 mg by mouth. Patient takes as needed for dizziness., Disp: , Rfl:    dicyclomine (BENTYL) 10 MG capsule, TAKE 1 CAPSULE (10 MG TOTAL) BY MOUTH EVERY 8 (EIGHT) HOURS  AS NEEDED FOR SPASMS., Disp: 270 capsule, Rfl: 1   diphenhydrAMINE (BENADRYL) 25 MG tablet, Take 25 mg by mouth at bedtime., Disp: , Rfl:    ergocalciferol (VITAMIN D2) 1.25 MG (50000 UT) capsule, Take 50,000 Units by mouth once a week. Patient states that she takes 1 by mouth twice a week., Disp: , Rfl:    furosemide (LASIX) 20 MG tablet, , Disp: , Rfl:    levothyroxine (SYNTHROID) 25 MCG tablet, Take 25 mcg by mouth daily., Disp: , Rfl:    linaclotide (LINZESS) 145 MCG CAPS capsule, Take 1 capsule (145 mcg total) by mouth daily before breakfast., Disp: 90 capsule, Rfl: 1   meclizine (ANTIVERT) 12.5 MG tablet, Take 12.5 mg by mouth 3 (three) times daily as needed for dizziness., Disp: , Rfl:    metFORMIN (GLUCOPHAGE-XR) 500 MG 24 hr tablet, Take 2 tablets by mouth in the morning and at bedtime., Disp: , Rfl:    metoprolol tartrate (LOPRESSOR) 50 MG tablet, Take 75 mg by mouth 2 (two) times daily., Disp: , Rfl:    OXYGEN, Inhale 2 L into the lungs continuous. 3 L at night, Disp: , Rfl:    pantoprazole (PROTONIX) 40 MG tablet, TAKE 1 TABLET BY MOUTH EVERY DAY, Disp: 90 tablet, Rfl: 3   pravastatin (PRAVACHOL) 10 MG tablet, Take 10 mg by mouth daily., Disp: , Rfl:     Allergies as of 08/30/2022 - Review Complete 08/30/2022  Allergen Reaction Noted   Egg-derived products Anaphylaxis and Other (See Comments) 02/08/2009   Iodinated contrast media Hives 01/02/2019   Lisinopril  01/28/2015   Adhesive [tape]  01/02/2019   Asa [aspirin]  01/02/2019   Asmanex (120 metered doses) [mometasone furoate]  01/02/2019   Biaxin [clarithromycin]  01/02/2019   Erythromycin  01/02/2019   Evista [raloxifene hcl]  01/02/2019   Influenza vaccines  01/02/2019   Nexium [esomeprazole magnesium]  01/02/2019   Norco [hydrocodone-acetaminophen]  01/02/2019   Oxycodone  01/02/2019   Prilosec [omeprazole]  01/02/2019   Qvar [beclomethasone]  01/02/2019   Tessalon [benzonatate]  01/02/2019   Topiramate Itching  06/19/2018    Past Medical History:  Diagnosis Date   Allergic rhinitis from [facility] encounter 06/30/2019   Allergy    SEASONAL   Anxiety disorder    from [facility] encounter 06/30/2019   Arthritis of lumbosacral spine    Asthma    Asthma    from [facility] encounter 06/30/2019   Cataract    BILATERAL-REMOVED   Cervical arthritis    Chest pain    Colitis 11/25/1999   from [facility] encounter 06/30/2019   Colitis due to Clostridium difficile 07/14/2003   from [facility] encounter 06/30/2019   Colon polyp    from [facility] encounter 06/30/2019   Diabetes (HCC)    Diarrhea    Dizzinesses    Essential hypertension    GERD (gastroesophageal reflux disease)    H. pylori infection  Hx of labyrinthitis 08/14/2006   from [facility] encounter 06/30/2019   Hyperlipemia    Hypokalemia 08/04/2003   from [facility] encounter 06/30/2019   Hypokalemia 08/04/2003   Hypothyroid    Menopausal syndrome    from [facility] encounter 06/30/2019   Neck pain    from [facility] encounter 06/30/2019   Osteoporosis    Palpitations    Preoperative cardiovascular examination    SNHL (sensorineural hearing loss)    from [facility] encounter 06/30/2019   Stroke [facility])    Unsteady gait    from [facility] encounter 06/30/2019    Past Surgical History:  Procedure Laterality Date   ABDOMINAL HYSTERECTOMY     ALLOGRAFT SPINE MORSELIZED Bilateral 1226/2017   from [facility] encounter 06/30/2019, DrDavid Prior   APPENDECTOMY     CATARACT EXTRACTION Bilateral    CHOLECYSTECTOMY     COLONOSCOPY     LAPAROTOMY     with mesh (per pt, bowel tissue adhered due to endometriosis)   SPINE SURGERY  10/31/2021   TONSILLECTOMY     UPPER GASTROINTESTINAL ENDOSCOPY      Family History  Problem Relation Age of Onset   Osteoporosis Mother    Stroke Mother    Pancreatic cancer Mother    Hypertension Father    Epilepsy Sister    Hypertension Sister     Liver cancer Sister    Diabetes Sister    Hypertension Sister    Healthy Sister    Hypertension Sister    Hypertension Sister    Hypertension Sister    Lung disease Sister    Hypertension Sister    Cirrhosis Sister    Hepatitis C Sister    Epilepsy Brother    Colon cancer Brother    Hypertension Brother    Other Brother        Brain bleed   Leukemia Brother    Stomach cancer Paternal Aunt    Celiac disease Nephew    Celiac disease Niece    Esophageal cancer Neg Hx    Rectal cancer Neg Hx     Social History   Socioeconomic History   Marital status: Married    Spouse name: Tommy   Number of children: 0   Years of education: Not on file   Highest education level: Not on file  Occupational History   Occupation: retired  Tobacco Use   Smoking status: Never   Smokeless tobacco: Never  Vaping Use   Vaping Use: Never used  Substance and Sexual Activity   Alcohol use: Never   Drug use: Never   Sexual activity: Yes    Partners: Male    Birth control/protection: Surgical  Other Topics Concern   Not on file  Social History Narrative   Not on file   Social Determinants of Health   Financial Resource Strain: Not on file  Food Insecurity: Not on file  Transportation Needs: Not on file  Physical Activity: Not on file  Stress: Not on file  Social Connections: Not on file  Intimate Partner Violence: Not on file    Review of systems: Review of Systems  Constitutional:  Negative for unexpected weight change.  HENT:  Negative for trouble swallowing.   Gastrointestinal:  Positive for abdominal pain and nausea. Negative for abdominal distention, anal bleeding, blood in stool, constipation, diarrhea, rectal pain and vomiting.    Physical Exam: There were no vitals filed for this visit.  Body mass index is 37.68 kg/m. General: well-appearing  Eyes: sclera anicteric, no redness ENT: oral mucosa moist without lesions, no cervical or supraclavicular  lymphadenopathy CV: RRR, no JVD, no peripheral edema Resp: clear to auscultation bilaterally, normal RR and effort noted GI: soft, no tenderness, with active bowel sounds. No guarding or palpable organomegaly noted. Skin; warm and dry, no rash or jaundice noted Neuro: awake, alert and oriented x 3. Normal gross motor function and fluent speech    Data Reviewed:  Reviewed labs, radiology imaging, old records and pertinent past GI work up   Assessment and Plan/Recommendations:  67 year old very pleasant female with history of endometriosis, ex lap, TAH, BSO, appendectomy and cholecystectomy with extensive intra-abdominal and pelvic adhesions with irritable bowel syndrome with predominant constipation   Nausea and vomiting: Multifactorial, IBS constipation and rapid gastric emptying likely contributing to her symptoms Improved with small frequent meals with high-protein diet Avoid sugar or high carb diet Trial of FDgard 1 capsule up to 3 times daily as needed for dyspepsia   Intermittent lower abdominal cramping and discomfort secondary to IBS constipation IBS predominant constipation Will do a trial of Linzess 145 mcg daily, if develops severe diarrhea advised patient to stop and go back to lower dose 72 mcg daily of Linzess    Hemochromatosis and fatty liver: Normal LFT.  She gets intermittent phlebotomy through hematology   Intermittent abdominal cramping: Use dicyclomine 10 mg every 8 hours as needed   GERD: Continue Protonix and antireflux measures    -Advised to have small meals every 3-4 hours - Advised to increase water intake  -Anti nausea as needed  -Continue Linzess 145 mcg, dicyclomine 10 mg  -return in 6 months    The patient was provided an opportunity to ask questions and all were answered. The patient agreed with the plan and demonstrated an understanding of the instructions.  Iona Beard , MD    CC: [name], MD  Ladona Mow Hewitt Shorts as a scribe  for Marsa Aris, MD.,have documented all relevant documentation on the behalf of Marsa Aris, MD,as directed by  Marsa Aris, MD while in the presence of Marsa Aris, MD.   I, Marsa Aris, MD, have reviewed all documentation for this visit. The documentation on 08/30/22 for the exam, diagnosis, procedures, and orders are all accurate and complete.

## 2022-08-30 ENCOUNTER — Ambulatory Visit (INDEPENDENT_AMBULATORY_CARE_PROVIDER_SITE_OTHER): Payer: Medicare Other | Admitting: Gastroenterology

## 2022-08-30 ENCOUNTER — Encounter: Payer: Self-pay | Admitting: Gastroenterology

## 2022-08-30 VITALS — BP 118/68 | HR 52 | Ht 62.0 in | Wt 206.0 lb

## 2022-08-30 DIAGNOSIS — R110 Nausea: Secondary | ICD-10-CM

## 2022-08-30 DIAGNOSIS — K5904 Chronic idiopathic constipation: Secondary | ICD-10-CM | POA: Diagnosis not present

## 2022-08-30 MED ORDER — PANTOPRAZOLE SODIUM 40 MG PO TBEC: 40.0000 mg | DELAYED_RELEASE_TABLET | Freq: Every day | ORAL | 3 refills | Status: DC

## 2022-08-30 MED ORDER — DICYCLOMINE HCL 10 MG PO CAPS: 10.0000 mg | ORAL_CAPSULE | Freq: Three times a day (TID) | ORAL | 3 refills | Status: DC | PRN

## 2022-08-30 NOTE — Patient Instructions (Addendum)
We will call in your Linzess refills   to [facility] Patient Assistance program (Called and refilled Linzess it will ship out on 7/13 and the medication has 1 refill remaining   We will send Dicyclomine and pantoprazole  to your regular [facility] pharmacy ____________________________________________________  If your blood pressure at your visit was 140/90 or greater, please contact your primary care physician to follow up on this.  _______________________________________________________  If you are age 68 or older, your body mass index should be between 23-30. Your Body mass index is 37.68 kg/m. If this is out of the aforementioned range listed, please consider follow up with your Primary Care Provider.  If you are age 47 or younger, your body mass index should be between 19-25. Your Body mass index is 37.68 kg/m. If this is out of the aformentioned range listed, please consider follow up with your Primary Care Provider.   ________________________________________________________  The [facility] providers would like to encourage you to use [facility] to communicate with providers for non-urgent requests or questions.  Due to long hold times on the telephone, sending your provider a message by [facility] may be a faster and more efficient way to get a response.  Please allow 48 business hours for a response.  Please remember that this is for non-urgent requests.  _______________________________________________________   I appreciate the  opportunity to care for you  Thank You   Marsa Aris , MD

## 2022-08-31 ENCOUNTER — Encounter: Payer: Self-pay | Admitting: Gastroenterology

## 2022-10-19 ENCOUNTER — Inpatient Hospital Stay: Payer: Medicare Other

## 2022-10-19 ENCOUNTER — Inpatient Hospital Stay (HOSPITAL_BASED_OUTPATIENT_CLINIC_OR_DEPARTMENT_OTHER): Payer: Medicare Other | Admitting: Internal Medicine

## 2022-10-19 ENCOUNTER — Inpatient Hospital Stay: Payer: Medicare Other | Attending: Internal Medicine

## 2022-10-19 ENCOUNTER — Other Ambulatory Visit: Payer: Self-pay

## 2022-10-19 DIAGNOSIS — D751 Secondary polycythemia: Secondary | ICD-10-CM | POA: Insufficient documentation

## 2022-10-19 LAB — CMP (CANCER CENTER ONLY)
ALT: 7 U/L (ref 0–44)
AST: 13 U/L — ABNORMAL LOW (ref 15–41)
Albumin: 4.1 g/dL (ref 3.5–5.0)
Alkaline Phosphatase: 53 U/L (ref 38–126)
Anion gap: 7 (ref 5–15)
BUN: 18 mg/dL (ref 8–23)
CO2: 27 mmol/L (ref 22–32)
Calcium: 9.6 mg/dL (ref 8.9–10.3)
Chloride: 104 mmol/L (ref 98–111)
Creatinine: 1.33 mg/dL — ABNORMAL HIGH (ref 0.44–1.00)
GFR, Estimated: 44 mL/min — ABNORMAL LOW (ref >60)
Glucose, Bld: 96 mg/dL (ref 70–99)
Potassium: 4.5 mmol/L (ref 3.5–5.1)
Sodium: 138 mmol/L (ref 135–145)
Total Bilirubin: 0.3 mg/dL (ref 0.3–1.2)
Total Protein: 7.1 g/dL (ref 6.5–8.1)

## 2022-10-19 LAB — IRON AND IRON BINDING CAPACITY (CC-WL,HP ONLY)
Iron: 114 ug/dL (ref 28–170)
Saturation Ratios: 29 % (ref 10.4–31.8)
TIBC: 400 ug/dL (ref 250–450)
UIBC: 286 ug/dL (ref 148–442)

## 2022-10-19 LAB — CBC WITH DIFFERENTIAL (CANCER CENTER ONLY)
Abs Immature Granulocytes: 0.03 10*3/uL (ref 0.00–0.07)
Basophils Absolute: 0.1 10*3/uL (ref 0.0–0.1)
Basophils Relative: 1 %
Eosinophils Absolute: 0.2 10*3/uL (ref 0.0–0.5)
Eosinophils Relative: 2 %
HCT: 35.7 % — ABNORMAL LOW (ref 36.0–46.0)
Hemoglobin: 12.2 g/dL (ref 12.0–15.0)
Immature Granulocytes: 0 %
Lymphocytes Relative: 21 %
Lymphs Abs: 1.8 10*3/uL (ref 0.7–4.0)
MCH: 30 pg (ref 26.0–34.0)
MCHC: 34.2 g/dL (ref 30.0–36.0)
MCV: 87.7 fL (ref 80.0–100.0)
Monocytes Absolute: 0.7 10*3/uL (ref 0.1–1.0)
Monocytes Relative: 8 %
Neutro Abs: 5.8 10*3/uL (ref 1.7–7.7)
Neutrophils Relative %: 68 %
Platelet Count: 227 10*3/uL (ref 150–400)
RBC: 4.07 MIL/uL (ref 3.87–5.11)
RDW: 13.2 % (ref 11.5–15.5)
WBC Count: 8.7 10*3/uL (ref 4.0–10.5)
nRBC: 0 % (ref 0.0–0.2)

## 2022-10-19 LAB — FERRITIN: Ferritin: 33 ng/mL (ref 11–307)

## 2022-10-19 NOTE — Progress Notes (Signed)
[facility] [phone]   [phone]  OFFICE PROGRESS NOTE  [name], MD [address] 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  She has negative JAK2 mutation panel.  PRIOR THERAPY: None  CURRENT THERAPY: Phlebotomy on as-needed basis.  INTERVAL HISTORY: Laurie Finley 67 y.o. female returns to the clinic today for follow-up visit accompanied by her husband.  The patient is feeling fine today with no concerning complaints except for the mild fatigue and baseline shortness of breath.  She denied having any chest pain, cough or hemoptysis.  She has occasional nausea, but no vomiting, diarrhea or constipation.  She has no recent weight loss or night sweats.  She is here today for evaluation with repeat blood work.  Her last ferritin level was 27 in April 2024 by her primary care provider.  MEDICAL HISTORY: Past Medical History:  Diagnosis Date   Allergic rhinitis from [facility] encounter 06/30/2019   Allergy    SEASONAL   Anxiety disorder    from [facility] encounter 06/30/2019   Arthritis of lumbosacral spine    Asthma    Asthma    from [facility] encounter 06/30/2019   Cataract    BILATERAL-REMOVED   Cervical arthritis    Chest pain    Colitis 11/25/1999   from [facility] encounter 06/30/2019   Colitis due to Clostridium difficile 07/14/2003   from [facility] encounter 06/30/2019   Colon polyp    from [facility] encounter 06/30/2019   Diabetes (HCC)    Diarrhea    Dizzinesses    Essential hypertension    GERD (gastroesophageal reflux disease)    H. pylori infection    Hx of labyrinthitis 08/14/2006   from [facility] encounter 06/30/2019   Hyperlipemia    Hypokalemia 08/04/2003   from [facility] encounter 06/30/2019   Hypokalemia 08/04/2003   Hypothyroid    Menopausal  syndrome    from [facility] encounter 06/30/2019   Neck pain    from [facility] encounter 06/30/2019   Osteoporosis    Palpitations    Preoperative cardiovascular examination    SNHL (sensorineural hearing loss)    from [facility] encounter 06/30/2019   Stroke [facility])    Unsteady gait    from [facility] encounter 06/30/2019    ALLERGIES:  is allergic to egg-derived products, iodinated contrast media, lisinopril, adhesive [tape], asa [aspirin], asmanex (120 metered doses) [mometasone furoate], biaxin [clarithromycin], erythromycin, evista [raloxifene hcl], influenza vaccines, nexium [esomeprazole magnesium], norco [hydrocodone-acetaminophen], oxycodone, prilosec [omeprazole], qvar [beclomethasone], tessalon [benzonatate], and topiramate.  MEDICATIONS:  Current Outpatient Medications  Medication Sig Dispense Refill   acetaminophen (TYLENOL) 500 MG tablet Take 500 mg by mouth every 6 (six) hours as needed for mild pain or moderate pain.     albuterol (VENTOLIN HFA) 108 (90 Base) MCG/ACT inhaler Inhale 1 puff into the lungs every 6 (six) hours as needed for wheezing or shortness of breath.     Albuterol Sulfate 108 (90 Base) MCG/ACT AEPB Inhale into the lungs. Nebulizer     amiodarone (PACERONE) 100 MG tablet Take 100 mg by mouth daily.     amitriptyline (ELAVIL) 10 MG tablet Take 10 mg by mouth at bedtime.     arformoterol (BROVANA) 15 MCG/2ML NEBU Inhale 15 mcg into the lungs in the morning and at bedtime.     Butalbital-APAP-Caff-Cod  50-300-40-30 MG CAPS Take by mouth as needed.     Ca Carbonate-Mag Hydroxide (ROLAIDS PO) Take by mouth. Patient states that she uses 2-3 times weekly.     Cyanocobalamin (VITAMIN B-12 IJ) Inject as directed every 21 ( twenty-one) days.      denosumab (PROLIA) 60 MG/ML SOSY injection Inject 60 mg into the skin every 6 (six) months.     diazepam (VALIUM) 2 MG tablet Take 2 mg by mouth. Patient takes as needed for dizziness.     dicyclomine (BENTYL)  10 MG capsule Take 1 capsule (10 mg total) by mouth every 8 (eight) hours as needed for spasms. 270 capsule 3   diphenhydrAMINE (BENADRYL) 25 MG tablet Take 25 mg by mouth at bedtime.     ergocalciferol (VITAMIN D2) 1.25 MG (50000 UT) capsule Take 50,000 Units by mouth once a week. Patient states that she takes 1 by mouth twice a week.     furosemide (LASIX) 20 MG tablet      levothyroxine (SYNTHROID) 25 MCG tablet Take 25 mcg by mouth daily.     linaclotide (LINZESS) 145 MCG CAPS capsule Take 1 capsule (145 mcg total) by mouth daily before breakfast. 90 capsule 1   meclizine (ANTIVERT) 12.5 MG tablet Take 12.5 mg by mouth 3 (three) times daily as needed for dizziness.     metFORMIN (GLUCOPHAGE-XR) 500 MG 24 hr tablet Take 2 tablets by mouth in the morning and at bedtime.     metoprolol tartrate (LOPRESSOR) 50 MG tablet Take 75 mg by mouth 2 (two) times daily.     OXYGEN Inhale 2 L into the lungs continuous. 3 L at night     pantoprazole (PROTONIX) 40 MG tablet Take 1 tablet (40 mg total) by mouth daily. 90 tablet 3   pravastatin (PRAVACHOL) 10 MG tablet Take 10 mg by mouth daily.     No current facility-administered medications for this visit.    SURGICAL HISTORY:  Past Surgical History:  Procedure Laterality Date   ABDOMINAL HYSTERECTOMY     ALLOGRAFT SPINE MORSELIZED Bilateral 1226/2017   from [facility] encounter 06/30/2019, DrDavid Prior   APPENDECTOMY     CATARACT EXTRACTION Bilateral    CHOLECYSTECTOMY     COLONOSCOPY     LAPAROTOMY     with mesh (per pt, bowel tissue adhered due to endometriosis)   SPINE SURGERY  10/31/2021   TONSILLECTOMY     UPPER GASTROINTESTINAL ENDOSCOPY      REVIEW OF SYSTEMS:  A comprehensive review of systems was negative except for: Constitutional: positive for fatigue Respiratory: positive for dyspnea on exertion Gastrointestinal: positive for nausea   PHYSICAL EXAMINATION: General appearance: alert, cooperative, fatigued, and no  distress Head: Normocephalic, without obvious abnormality, atraumatic Neck: no adenopathy, no JVD, supple, symmetrical, trachea midline, and thyroid not enlarged, symmetric, no tenderness/mass/nodules Lymph nodes: Cervical, supraclavicular, and axillary nodes normal. Resp: clear to auscultation bilaterally Back: symmetric, no curvature. ROM normal. No CVA tenderness. Cardio: regular rate and rhythm, S1, S2 normal, no murmur, click, rub or gallop GI: soft, non-tender; bowel sounds normal; no masses,  no organomegaly Extremities: extremities normal, atraumatic, no cyanosis or edema  ECOG PERFORMANCE STATUS: 1 - Symptomatic but completely ambulatory  Blood pressure (!) 130/56, pulse (!) 52, temperature 98.2 F (36.8 C), temperature source Oral, resp. rate 17, weight 207 lb 3.2 oz (94 kg), SpO2 100%.  LABORATORY DATA: Lab Results  Component Value Date   WBC 8.7 10/19/2022   HGB 12.2 10/19/2022  HCT 35.7 (L) 10/19/2022   MCV 87.7 10/19/2022   PLT 227 10/19/2022      Chemistry      Component Value Date/Time   NA 136 04/20/2022 1426   K 4.1 04/20/2022 1426   CL 104 04/20/2022 1426   CO2 24 04/20/2022 1426   BUN 11 04/20/2022 1426   CREATININE 0.95 04/20/2022 1426   CREATININE 0.80 01/02/2019 1329      Component Value Date/Time   CALCIUM 9.7 04/20/2022 1426   ALKPHOS 61 04/20/2022 1426   AST 17 04/20/2022 1426   ALT 7 04/20/2022 1426   BILITOT 0.3 04/20/2022 1426       RADIOGRAPHIC STUDIES: No results found.   ASSESSMENT AND PLAN: This is a very pleasant 67 years old white female recently diagnosed with hemochromatosis with DNA mutation and C282Y and H63D.  The patient continues to complain of increasing fatigue and weakness as well as intermittent abdominal pain and nausea. Her Jak 2 mutation panel was unremarkable. The patient is currently on phlebotomy to keep her ferritin level less than 50. The patient is feeling fine today with no concerning complaints except for  the mild fatigue and shortness of breath with exertion. Repeat blood work is unremarkable except for the slightly elevated serum creatinine likely due to medication.  Ferritin level is still pending. Her last ferritin level in April was 27.  I recommended for the patient to continue on observation for now but I will consider her for repeat phlebotomy of the pending ferritin level is higher than 50. I will see her back for follow-up visit in 6 months for evaluation and repeat blood work. She was advised to call immediately if she has any other concerning symptoms in the interval. The patient voices understanding of current disease status and treatment options and is in agreement with the current care plan. The total time spent in the appointment was 20 minutes.  All questions were answered. The patient knows to call the clinic with any problems, questions or concerns. We can certainly see the patient much sooner if necessary.  Disclaimer: This note was dictated with voice recognition software. Similar sounding words can inadvertently be transcribed and may not be corrected upon review.

## 2022-12-28 ENCOUNTER — Encounter: Payer: Self-pay | Admitting: Gastroenterology

## 2023-02-21 HISTORY — PX: OTHER SURGICAL HISTORY: SHX169

## 2023-03-28 ENCOUNTER — Ambulatory Visit: Payer: Medicare Other | Admitting: Gastroenterology

## 2023-03-28 ENCOUNTER — Encounter: Payer: Self-pay | Admitting: Gastroenterology

## 2023-03-28 VITALS — BP 110/70 | HR 53 | Ht 62.0 in | Wt 202.4 lb

## 2023-03-28 DIAGNOSIS — R109 Unspecified abdominal pain: Secondary | ICD-10-CM

## 2023-03-28 DIAGNOSIS — K581 Irritable bowel syndrome with constipation: Secondary | ICD-10-CM | POA: Diagnosis not present

## 2023-03-28 DIAGNOSIS — R110 Nausea: Secondary | ICD-10-CM

## 2023-03-28 NOTE — Patient Instructions (Addendum)
 VISIT SUMMARY:  During today's visit, we discussed your ongoing issues with constipation and irritable bowel syndrome, as well as your recent history of nausea, cardiovascular disease, and hyperparathyroidism. We reviewed your current medications and management strategies for each condition.  YOUR PLAN:  -CHRONIC CONSTIPATION: Chronic constipation means having infrequent or difficult bowel movements. You are currently taking Linzess  145mg  daily, which helps most of the time. On days without bowel movements, you should use half a cap of Miralax, increasing to one cap if you still do not have a bowel movement the following day. Please drink 6-8 cups of water daily to help manage this condition.  -IRRITABLE BOWEL SYNDROME: Irritable bowel syndrome is a disorder that causes abdominal pain and changes in bowel habits. You are managing this with Dicyclomine  as needed, which helps alleviate the pain. Continue with this current management.  -NAUSEA: Nausea is the feeling of needing to vomit. Your nausea has improved and is not as severe as before. Continue with your current management.  -CARDIOVASCULAR DISEASE: Cardiovascular disease involves conditions that affect the heart. You recently had a stent placed for significant coronary artery disease. Continue with your current management and medications as directed by your cardiologist.  -HYPERPARATHYROIDISM: Hyperparathyroidism is a condition where the parathyroid glands produce too much hormone, affecting calcium levels. Continue with your current management and medications as directed by your endocrinologist.  -HYPERTENSION: Hypertension is high blood pressure. Your condition is stable, and your medications were recently adjusted by your cardiologist. Continue with your current management and medications as directed by your cardiologist.  INSTRUCTIONS:  Please follow up in 6 months for a review of your conditions and management plans.  I appreciate the   opportunity to care for you  Thank You   Kavitha Nandigam , MD

## 2023-03-28 NOTE — Progress Notes (Signed)
Laurie Finley    [id number]    [birth year]  Primary Care Physician:Eason, Renae Fickle, MD  Referring Physician: [name], MD [id number] [facility] [address] [place],  [place] [address]   Chief complaint: IBS constipation  Discussed the use of AI scribe software for clinical note transcription with the patient, who gave verbal consent to proceed.  History of Present Illness   68 year old very pleasant female, known to have a history of constipation and irritable bowel syndrome, reports ongoing issues with bowel movements. She is currently on Linzess 145 daily, which she reports helps most of the time. On average, she has bowel movements four to five times a week, sometimes going two days without a bowel movement. When she does not have a bowel movement, she takes Miralax, which has previously caused an episode of diarrhea. She also reports cramping and pain associated with her irritable bowel syndrome, which occurs occasionally more than once a day and usually precedes a bowel movement. She manages this with Dicyclomine, which she reports usually alleviates the pain.  The patient also reports a history of nausea, which she states has improved and is not as severe as it was previously. She is able to eat most of the time. She also mentions a recent stent placement in her heart and a diagnosis of parathyroid disease. She has been advised to drink a lot of water to manage her kidney numbers. She reports no other issues at this time.      GI Hx: Gastric emptying study 10/23/20 Expected location of the stomach in the left upper quadrant. Ingested meal empties the stomach gradually over the course of the study with 11.24% retention at 60 min and 2.55% retention at 120 min (normal retention less than 30% at a 120 min).   TTG Ig Ab negative for celiac disease     History of hemochromatosis, gets periodic phlebotomy for iron chelation.   CT abdomen and pelvis 08/15/2019 No CT findings to account for  the patient's chronic abdominal pain. Status post cholecystectomy, appendectomy, and hysterectomy.   Colonoscopy October 03, 2019: For diarrhea evaluation, random colon biopsies negative for microscopic colitis.  Diverticulosis and internal hemorrhoid EGD October 03, 2019: Mild erosive esophagitis secondary to reflux, hiatal hernia, CLOtest negative for H. pylori otherwise normal exam     Outpatient Encounter Medications as of 03/28/2023  Medication Sig   acetaminophen (TYLENOL) 500 MG tablet Take 500 mg by mouth every 6 (six) hours as needed for mild pain or moderate pain.   albuterol (VENTOLIN HFA) 108 (90 Base) MCG/ACT inhaler Inhale 1 puff into the lungs every 6 (six) hours as needed for wheezing or shortness of breath.   Albuterol Sulfate 108 (90 Base) MCG/ACT AEPB Inhale into the lungs. Nebulizer   amitriptyline (ELAVIL) 10 MG tablet Take 10 mg by mouth at bedtime.   arformoterol (BROVANA) 15 MCG/2ML NEBU Inhale 15 mcg into the lungs in the morning and at bedtime.   Butalbital-APAP-Caff-Cod 50-300-40-30 MG CAPS Take by mouth as needed.   Ca Carbonate-Mag Hydroxide (ROLAIDS PO) Take by mouth. Patient states that she uses 2-3 times weekly.   clopidogrel (PLAVIX) 75 MG tablet Take 75 mg by mouth daily.   Cyanocobalamin (VITAMIN B-12 IJ) Inject as directed every 21 ( twenty-one) days.    denosumab (PROLIA) 60 MG/ML SOSY injection Inject 60 mg into the skin every 6 (six) months.   diazepam (VALIUM) 2 MG tablet Take 2 mg by mouth. Patient takes as  needed for dizziness.   dicyclomine (BENTYL) 10 MG capsule Take 1 capsule (10 mg total) by mouth every 8 (eight) hours as needed for spasms.   diphenhydrAMINE (BENADRYL) 25 MG tablet Take 25 mg by mouth at bedtime.   ergocalciferol (VITAMIN D2) 1.25 MG (50000 UT) capsule Take 50,000 Units by mouth once a week. Patient states that she takes 1 by mouth twice a week.   furosemide (LASIX) 20 MG tablet    levothyroxine (SYNTHROID) 25 MCG tablet Take 25 mcg by  mouth daily.   linaclotide (LINZESS) 145 MCG CAPS capsule Take 1 capsule (145 mcg total) by mouth daily before breakfast.   meclizine (ANTIVERT) 12.5 MG tablet Take 12.5 mg by mouth 3 (three) times daily as needed for dizziness.   metFORMIN (GLUCOPHAGE-XR) 500 MG 24 hr tablet Take 2 tablets by mouth in the morning and at bedtime.   metoprolol tartrate (LOPRESSOR) 25 MG tablet Take 50 mg by mouth 2 (two) times daily.   OXYGEN Inhale 2 L into the lungs continuous. 3 L at night   pantoprazole (PROTONIX) 40 MG tablet Take 1 tablet (40 mg total) by mouth daily.   pravastatin (PRAVACHOL) 10 MG tablet Take 10 mg by mouth daily.   [DISCONTINUED] amiodarone (PACERONE) 100 MG tablet Take 100 mg by mouth daily.   [DISCONTINUED] metoprolol tartrate (LOPRESSOR) 50 MG tablet Take 75 mg by mouth 2 (two) times daily.   No facility-administered encounter medications on file as of 03/28/2023.    Allergies as of 03/28/2023 - Review Complete 03/28/2023  Allergen Reaction Noted   Egg-derived products Anaphylaxis and Other (See Comments) 02/08/2009   Iodinated contrast media Hives 01/02/2019   Lisinopril  01/28/2015   Adhesive [tape]  01/02/2019   Asa [aspirin]  01/02/2019   Asmanex (120 metered doses) [mometasone furoate]  01/02/2019   Biaxin [clarithromycin]  01/02/2019   Erythromycin  01/02/2019   Evista [raloxifene hcl]  01/02/2019   Influenza vaccines  01/02/2019   Nexium [esomeprazole magnesium]  01/02/2019   Norco [hydrocodone-acetaminophen]  01/02/2019   Oxycodone  01/02/2019   Prilosec [omeprazole]  01/02/2019   Qvar [beclomethasone]  01/02/2019   Tessalon [benzonatate]  01/02/2019   Topiramate Itching 06/19/2018    Past Medical History:  Diagnosis Date   Allergic rhinitis from [facility] encounter 06/30/2019   Allergy    SEASONAL   Anxiety disorder    from [facility] encounter 06/30/2019   Arthritis of lumbosacral spine    Asthma    Asthma    from [facility] encounter  06/30/2019   Cataract    BILATERAL-REMOVED   Cervical arthritis    Chest pain    Colitis 11/25/1999   from [facility] encounter 06/30/2019   Colitis due to Clostridium difficile 07/14/2003   from [facility] encounter 06/30/2019   Colon polyp    from [facility] encounter 06/30/2019   Diabetes (HCC)    Diarrhea    Dizzinesses    Essential hypertension    GERD (gastroesophageal reflux disease)    H. pylori infection    Hx of labyrinthitis 08/14/2006   from [facility] encounter 06/30/2019   Hyperlipemia    Hypokalemia 08/04/2003   from [facility] encounter 06/30/2019   Hypokalemia 08/04/2003   Hypothyroid    Idiopathic parathyroidism (HCC)    Menopausal syndrome    from [facility] encounter 06/30/2019   Neck pain    from [facility] encounter 06/30/2019   Osteoporosis    Palpitations    Preoperative  cardiovascular examination    SNHL (sensorineural hearing loss)    from [facility] encounter 06/30/2019   Stroke [facility])    Unsteady gait    from [facility] encounter 06/30/2019    Past Surgical History:  Procedure Laterality Date   ABDOMINAL HYSTERECTOMY     ALLOGRAFT SPINE MORSELIZED Bilateral 1226/2017   from [facility] encounter 06/30/2019, DrDavid Prior   APPENDECTOMY     CATARACT EXTRACTION Bilateral    CHOLECYSTECTOMY     COLONOSCOPY     LAPAROTOMY     with mesh (per pt, bowel tissue adhered due to endometriosis)   SPINE SURGERY  10/31/2021   stint  02/21/2023   TONSILLECTOMY     UPPER GASTROINTESTINAL ENDOSCOPY      Family History  Problem Relation Age of Onset   Osteoporosis Mother    Stroke Mother    Pancreatic cancer Mother    Hypertension Father    Epilepsy Sister    Hypertension Sister    Liver cancer Sister    Diabetes Sister    Hypertension Sister    Healthy Sister    Hypertension Sister    Hypertension Sister    Hypertension Sister    Lung disease Sister    Hypertension Sister    Cirrhosis Sister    Hepatitis C  Sister    Epilepsy Brother    Colon cancer Brother    Hypertension Brother    Other Brother        Brain bleed   Leukemia Brother    Stomach cancer Paternal Aunt    Celiac disease Nephew    Celiac disease Niece    Esophageal cancer Neg Hx    Rectal cancer Neg Hx     Social History   Socioeconomic History   Marital status: Married    Spouse name: Tommy   Number of children: 0   Years of education: Not on file   Highest education level: Not on file  Occupational History   Occupation: retired  Tobacco Use   Smoking status: Never   Smokeless tobacco: Never  Vaping Use   Vaping status: Never Used  Substance and Sexual Activity   Alcohol use: Never   Drug use: Never   Sexual activity: Yes    Partners: Male    Birth control/protection: Surgical  Other Topics Concern   Not on file  Social History Narrative   Not on file   Social Drivers of Health   Financial Resource Strain: Not on file  Food Insecurity: Not on file  Transportation Needs: Not on file  Physical Activity: Not on file  Stress: Not on file  Social Connections: Not on file  Intimate Partner Violence: Not on file      Review of systems: All other review of systems negative except as mentioned in the HPI.   Physical Exam: Vitals:   03/28/23 1115  BP: 110/70  Pulse: (!) 53  SpO2: 96%   Body mass index is 37.01 kg/m. Gen:      No acute distress HEENT:  sclera anicteric CV: s1s2 rrr, no murmur Lungs: B/l clear. Abd:      soft, non-tender; no palpable masses, no distension Ext:    No edema Neuro: alert and oriented x 3 Psych: normal mood and affect  Data Reviewed:  Reviewed labs, radiology imaging, old records and pertinent past GI work up     Assessment and Plan   68 year old very pleasant female with history of endometriosis, ex lap, TAH, BSO,  appendectomy and cholecystectomy with extensive intra-abdominal and pelvic adhesions with chronic idiopathic constipation and irritable bowel  syndrome with predominant constipation   Chronic Constipation Bowel movements 4-5 times per week with Linzess 145mg  daily. Occasionally uses Miralax with resultant diarrhea. -Continue Linzess 145mg  daily. -Use half a cap of Miralax on days without bowel movements, increasing to one cap if no bowel movement the following day. -Encouraged to drink 6-8 cups of water daily.  Irritable Bowel Syndrome Abdominal pain usually preceding bowel movements, managed with Dicyclomine. -Continue current management with Dicyclomine as needed.  Nausea Improved, not as severe as before. -Continue current management.  Cardiovascular Disease Recent stent placement for significant coronary artery disease. -Continue current management and medications as directed by cardiologist.  Hyperparathyroidism Recently diagnosed. -Continue current management and medications as directed by endocrinologist.  Hypertension Stable, recent adjustment of medications by cardiologist. -Continue current management and medications as directed by cardiologist.  Follow-up in 6 months.       This visit required 30 minutes of patient care (this includes precharting, chart review, review of results, face-to-face time used for counseling as well as treatment plan and follow-up. The patient was provided an opportunity to ask questions and all were answered. The patient agreed with the plan and demonstrated an understanding of the instructions.  Iona Beard , MD    CC: [name], MD

## 2023-04-23 ENCOUNTER — Inpatient Hospital Stay (HOSPITAL_BASED_OUTPATIENT_CLINIC_OR_DEPARTMENT_OTHER): Payer: Medicare Other | Admitting: Internal Medicine

## 2023-04-23 ENCOUNTER — Inpatient Hospital Stay: Payer: Medicare Other | Attending: Internal Medicine

## 2023-04-23 DIAGNOSIS — I2510 Atherosclerotic heart disease of native coronary artery without angina pectoris: Secondary | ICD-10-CM | POA: Diagnosis not present

## 2023-04-23 DIAGNOSIS — R109 Unspecified abdominal pain: Secondary | ICD-10-CM | POA: Diagnosis not present

## 2023-04-23 DIAGNOSIS — K589 Irritable bowel syndrome without diarrhea: Secondary | ICD-10-CM | POA: Diagnosis not present

## 2023-04-23 DIAGNOSIS — J449 Chronic obstructive pulmonary disease, unspecified: Secondary | ICD-10-CM | POA: Insufficient documentation

## 2023-04-23 DIAGNOSIS — G8929 Other chronic pain: Secondary | ICD-10-CM | POA: Diagnosis not present

## 2023-04-23 LAB — CBC WITH DIFFERENTIAL (CANCER CENTER ONLY)
Abs Immature Granulocytes: 0.02 10*3/uL (ref 0.00–0.07)
Basophils Absolute: 0.1 10*3/uL (ref 0.0–0.1)
Basophils Relative: 1 %
Eosinophils Absolute: 0.3 10*3/uL (ref 0.0–0.5)
Eosinophils Relative: 5 %
HCT: 36.4 % (ref 36.0–46.0)
Hemoglobin: 12.2 g/dL (ref 12.0–15.0)
Immature Granulocytes: 0 %
Lymphocytes Relative: 21 %
Lymphs Abs: 1.6 10*3/uL (ref 0.7–4.0)
MCH: 28.6 pg (ref 26.0–34.0)
MCHC: 33.5 g/dL (ref 30.0–36.0)
MCV: 85.2 fL (ref 80.0–100.0)
Monocytes Absolute: 0.7 10*3/uL (ref 0.1–1.0)
Monocytes Relative: 9 %
Neutro Abs: 4.9 10*3/uL (ref 1.7–7.7)
Neutrophils Relative %: 64 %
Platelet Count: 229 10*3/uL (ref 150–400)
RBC: 4.27 MIL/uL (ref 3.87–5.11)
RDW: 12.9 % (ref 11.5–15.5)
WBC Count: 7.6 10*3/uL (ref 4.0–10.5)
nRBC: 0 % (ref 0.0–0.2)

## 2023-04-23 LAB — CMP (CANCER CENTER ONLY)
ALT: 9 U/L (ref 0–44)
AST: 17 U/L (ref 15–41)
Albumin: 4.3 g/dL (ref 3.5–5.0)
Alkaline Phosphatase: 53 U/L (ref 38–126)
Anion gap: 7 (ref 5–15)
BUN: 17 mg/dL (ref 8–23)
CO2: 24 mmol/L (ref 22–32)
Calcium: 9.9 mg/dL (ref 8.9–10.3)
Chloride: 103 mmol/L (ref 98–111)
Creatinine: 1.29 mg/dL — ABNORMAL HIGH (ref 0.44–1.00)
GFR, Estimated: 45 mL/min — ABNORMAL LOW (ref >60)
Glucose, Bld: 110 mg/dL — ABNORMAL HIGH (ref 70–99)
Potassium: 3.9 mmol/L (ref 3.5–5.1)
Sodium: 134 mmol/L — ABNORMAL LOW (ref 135–145)
Total Bilirubin: 0.3 mg/dL (ref 0.0–1.2)
Total Protein: 7.1 g/dL (ref 6.5–8.1)

## 2023-04-23 LAB — IRON AND IRON BINDING CAPACITY (CC-WL,HP ONLY)
Iron: 61 ug/dL (ref 28–170)
Saturation Ratios: 14 % (ref 10.4–31.8)
TIBC: 452 ug/dL — ABNORMAL HIGH (ref 250–450)
UIBC: 391 ug/dL (ref 148–442)

## 2023-04-23 NOTE — Progress Notes (Signed)
 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  She has negative JAK2 mutation panel.  PRIOR THERAPY: None  CURRENT THERAPY: Phlebotomy on as-needed basis.   INTERVAL HISTORY: Laurie Finley 68 y.o. female returns to the clinic today for follow-up visit accompanied by her husband. Discussed the use of AI scribe software for clinical note transcription with the patient, who gave verbal consent to proceed.  History of Present Illness   Laurie Finley is a 68 year old female with hemochromatosis who presents for follow-up. She is accompanied by her husband.  She has a known diagnosis of hemochromatosis, identified through DNA testing as having a combined C282Y and H63D gene mutation, diagnosed in June 2021. She has undergone phlebotomy as needed in the past. Her last blood work in November showed favorable results with an iron level of approximately 39, indicating no immediate need for phlebotomy. Current CBC is normal, but she is awaiting iron and ferritin results.  She experiences chronic abdominal pain, which she describes as occurring 'for years' without a clear cause. She also mentions symptoms consistent with irritable bowel syndrome, including alternating diarrhea and constipation. No nausea or vomiting is present.  She has a history of significant cardiac issues, having had a stent placed due to a 99.99% blockage in one of her coronary arteries. The specific artery was not recalled during the conversation. She reports breathing issues.       MEDICAL HISTORY: Past Medical History:  Diagnosis Date   Allergic rhinitis from [facility] encounter 06/30/2019   Allergy    SEASONAL   Anxiety disorder    from [facility] encounter  06/30/2019   Arthritis of lumbosacral spine    Asthma    Asthma    from [facility] encounter 06/30/2019   Cataract    BILATERAL-REMOVED   Cervical arthritis    Chest pain    Colitis 11/25/1999   from [facility] 06/30/2019   Colitis due to Clostridium difficile 07/14/2003   from [facility] encounter 06/30/2019   Colon polyp    from [facility] encounter 06/30/2019   Diabetes (HCC)    Diarrhea    Dizzinesses    Essential hypertension    GERD (gastroesophageal reflux disease)    H. pylori infection    Hx of labyrinthitis 08/14/2006   from [facility] encounter 06/30/2019   Hyperlipemia    Hypokalemia 08/04/2003   from [facility] 06/30/2019   Hypokalemia 08/04/2003   Hypothyroid    Idiopathic parathyroidism (HCC)    Menopausal syndrome    from [facility] encounter 06/30/2019   Neck pain    from [facility] encounter 06/30/2019   Osteoporosis    Palpitations    Preoperative cardiovascular examination    SNHL (sensorineural hearing loss)    from [facility] encounter 06/30/2019   Stroke [facility])    Unsteady gait    from [facility] encounter 06/30/2019    ALLERGIES:  is allergic to egg-derived products, iodinated contrast media, lisinopril, adhesive [tape], asa [aspirin], asmanex (120 metered doses) [mometasone furoate], biaxin [clarithromycin], erythromycin, evista [raloxifene hcl], influenza vaccines, nexium [esomeprazole magnesium], norco [hydrocodone-acetaminophen], oxycodone, prilosec [omeprazole], qvar [beclomethasone], tessalon [benzonatate], and topiramate.  MEDICATIONS:  Current Outpatient Medications  Medication Sig Dispense Refill   acetaminophen (  TYLENOL) 500 MG tablet Take 500 mg by mouth every 6 (six) hours as needed for mild pain or moderate pain.     albuterol (VENTOLIN HFA) 108 (90 Base) MCG/ACT inhaler Inhale 1 puff into the lungs every 6 (six) hours as needed for wheezing or shortness of breath.     Albuterol  Sulfate 108 (90 Base) MCG/ACT AEPB Inhale into the lungs. Nebulizer     amitriptyline (ELAVIL) 10 MG tablet Take 10 mg by mouth at bedtime.     arformoterol (BROVANA) 15 MCG/2ML NEBU Inhale 15 mcg into the lungs in the morning and at bedtime.     Butalbital-APAP-Caff-Cod 50-300-40-30 MG CAPS Take by mouth as needed.     Ca Carbonate-Mag Hydroxide (ROLAIDS PO) Take by mouth. Patient states that she uses 2-3 times weekly.     clopidogrel (PLAVIX) 75 MG tablet Take 75 mg by mouth daily.     Cyanocobalamin (VITAMIN B-12 IJ) Inject as directed every 21 ( twenty-one) days.      denosumab (PROLIA) 60 MG/ML SOSY injection Inject 60 mg into the skin every 6 (six) months.     diazepam (VALIUM) 2 MG tablet Take 2 mg by mouth. Patient takes as needed for dizziness.     dicyclomine  (BENTYL ) 10 MG capsule Take 1 capsule (10 mg total) by mouth every 8 (eight) hours as needed for spasms. 270 capsule 3   diphenhydrAMINE (BENADRYL) 25 MG tablet Take 25 mg by mouth at bedtime.     ergocalciferol (VITAMIN D2) 1.25 MG (50000 UT) capsule Take 50,000 Units by mouth once a week. Patient states that she takes 1 by mouth twice a week.     furosemide (LASIX) 20 MG tablet      levothyroxine (SYNTHROID) 25 MCG tablet Take 25 mcg by mouth daily.     linaclotide  (LINZESS ) 145 MCG CAPS capsule Take 1 capsule (145 mcg total) by mouth daily before breakfast. 90 capsule 1   meclizine (ANTIVERT) 12.5 MG tablet Take 12.5 mg by mouth 3 (three) times daily as needed for dizziness.     metFORMIN (GLUCOPHAGE-XR) 500 MG 24 hr tablet Take 2 tablets by mouth in the morning and at bedtime.     metoprolol tartrate (LOPRESSOR) 25 MG tablet Take 50 mg by mouth 2 (two) times daily.     OXYGEN Inhale 2 L into the lungs continuous. 3 L at night     pantoprazole  (PROTONIX ) 40 MG tablet Take 1 tablet (40 mg total) by mouth daily. 90 tablet 3   pravastatin (PRAVACHOL) 10 MG tablet Take 10 mg by mouth daily.     No current facility-administered  medications for this visit.    SURGICAL HISTORY:  Past Surgical History:  Procedure Laterality Date   ABDOMINAL HYSTERECTOMY     ALLOGRAFT SPINE MORSELIZED Bilateral 1226/2017   from [facility] 06/30/2019, DrDavid Prior   APPENDECTOMY     CATARACT EXTRACTION Bilateral    CHOLECYSTECTOMY     COLONOSCOPY     LAPAROTOMY     with mesh (per pt, bowel tissue adhered due to endometriosis)   SPINE SURGERY  10/31/2021   stint  02/21/2023   TONSILLECTOMY     UPPER GASTROINTESTINAL ENDOSCOPY      REVIEW OF SYSTEMS:  A comprehensive review of systems was negative except for: Respiratory: positive for dyspnea on exertion Gastrointestinal: positive for change in bowel habits   PHYSICAL EXAMINATION: General appearance: alert, cooperative, fatigued, and no distress Head: Normocephalic, without obvious abnormality, atraumatic Neck: no  adenopathy, no JVD, supple, symmetrical, trachea midline, and thyroid not enlarged, symmetric, no tenderness/mass/nodules Lymph nodes: Cervical, supraclavicular, and axillary nodes normal. Resp: clear to auscultation bilaterally Back: symmetric, no curvature. ROM normal. No CVA tenderness. Cardio: regular rate and rhythm, S1, S2 normal, no murmur, click, rub or gallop GI: soft, non-tender; bowel sounds normal; no masses,  no organomegaly Extremities: extremities normal, atraumatic, no cyanosis or edema  ECOG PERFORMANCE STATUS: 1 - Symptomatic but completely ambulatory  Blood pressure 132/60, pulse 62, temperature 98 F (36.7 C), temperature source Temporal, resp. rate 17, weight 201 lb 6.4 oz (91.4 kg), SpO2 100%.  LABORATORY DATA: Lab Results  Component Value Date   WBC 7.6 04/23/2023   HGB 12.2 04/23/2023   HCT 36.4 04/23/2023   MCV 85.2 04/23/2023   PLT 229 04/23/2023      Chemistry      Component Value Date/Time   NA 138 10/19/2022 0918   K 4.5 10/19/2022 0918   CL 104 10/19/2022 0918   CO2 27 10/19/2022 0918   BUN 18 10/19/2022  0918   CREATININE 1.33 (H) 10/19/2022 0918   CREATININE 0.80 01/02/2019 1329      Component Value Date/Time   CALCIUM 9.6 10/19/2022 0918   ALKPHOS 53 10/19/2022 0918   AST 13 (L) 10/19/2022 0918   ALT 7 10/19/2022 0918   BILITOT 0.3 10/19/2022 0918       RADIOGRAPHIC STUDIES: No results found.   ASSESSMENT AND PLAN: This is a very pleasant 68 years old white female recently diagnosed with hemochromatosis with DNA mutation and C282Y and H63D.  The patient continues to complain of increasing fatigue and weakness as well as intermittent abdominal pain and nausea. Her Jak 2 mutation panel was unremarkable. The patient is currently on phlebotomy to keep her ferritin level less than 50. The patient is doing fine today with no concerning complaints except for the baseline shortness of breath secondary to COPD.     Hemochromatosis Diagnosed in June 2021 with C282Y and H63D mutation confirmed by genetic testing. Managed with phlebotomy as needed. Recent blood work in November showed iron levels at 39, indicating good control. Current CBC is normal; iron and ferritin results pending. If phlebotomy is needed, it will be scheduled separately to avoid unnecessary travel. - Await iron and ferritin results - Schedule phlebotomy if needed - Follow-up in six months  Coronary Artery Disease (CAD) 99.99% blockage in a coronary artery treated with stent placement. Continue current cardiac management and monitor for new symptoms. - Continue current cardiac management - Monitor for new symptoms  Irritable Bowel Syndrome (IBS) Chronic abdominal pain with alternating diarrhea and constipation. Discussed monitoring symptoms, dietary modifications, and stress management. - Monitor symptoms - Consider dietary modifications and stress management  General Health Maintenance Routine health maintenance and screenings as per primary care provider's recommendations. - Continue routine health maintenance  and screenings  Follow-up - Follow-up in six months unless phlebotomy is needed.   The patient was advised to call immediately if she has any concerning symptoms in the interval The patient voices understanding of current disease status and treatment options and is in agreement with the current care plan. The total time spent in the appointment was 20 minutes.  All questions were answered. The patient knows to call the clinic with any problems, questions or concerns. We can certainly see the patient much sooner if necessary.  Disclaimer: This note was dictated with voice recognition software. Similar sounding words can inadvertently be transcribed and  may not be corrected upon review.

## 2023-04-24 LAB — FERRITIN: Ferritin: 23 ng/mL (ref 11–307)

## 2023-09-02 ENCOUNTER — Other Ambulatory Visit: Payer: Self-pay | Admitting: Gastroenterology

## 2023-10-11 ENCOUNTER — Telehealth: Payer: Self-pay | Admitting: Internal Medicine

## 2023-10-11 NOTE — Telephone Encounter (Signed)
 Left the patient a voicemail with the rescheduled appointment details per provider on [place].

## 2023-10-18 NOTE — Progress Notes (Unsigned)
 [facility] OFFICE PROGRESS NOTE  Graydon Mt, MD [address] 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  She has negative JAK2 mutation panel.  PRIOR THERAPY: None  CURRENT THERAPY: Phlebotomy on as-needed basis.   INTERVAL HISTORY: Laurie Finley 68 y.o. female returns to the clinic today for follow-up visit.  The patient was last seen in the clinic in February 2025.  The patient has hemochromatosis. She denies any major changes in her health since she was last seen. She has chronic abdominal pain which has been going on for years. She also has cardiac issues.   Her energy is ***. She denies bronzing of the skin/discoloration. She avoids iron rich foods, multivitamins. Arthritis. ***diabetes-yes. ***Arrhythmia. She is here for evaluation and repeat blood work today.    MEDICAL HISTORY: Past Medical History:  Diagnosis Date   Allergic rhinitis from [facility] encounter 06/30/2019   Allergy    SEASONAL   Anxiety disorder    from [facility] encounter 06/30/2019   Arthritis of lumbosacral spine    Asthma    Asthma    from [facility] encounter 06/30/2019   Cataract    BILATERAL-REMOVED   Cervical arthritis    Chest pain    Colitis 11/25/1999   from [facility] encounter 06/30/2019   Colitis due to Clostridium difficile 07/14/2003   from [facility] encounter 06/30/2019   Colon polyp    from [facility] encounter 06/30/2019   Diabetes (HCC)    Diarrhea    Dizzinesses    Essential hypertension    GERD (gastroesophageal reflux disease)    H. pylori infection    Hx of labyrinthitis 08/14/2006   from [facility] encounter 06/30/2019   Hyperlipemia    Hypokalemia 08/04/2003   from [facility] encounter 06/30/2019   Hypokalemia 08/04/2003   Hypothyroid    Idiopathic parathyroidism (HCC)    Menopausal syndrome     from [facility] encounter 06/30/2019   Neck pain    from [facility] encounter 06/30/2019   Osteoporosis    Palpitations    Preoperative cardiovascular examination    SNHL (sensorineural hearing loss)    from [facility] encounter 06/30/2019   Stroke [facility])    Unsteady gait    from [facility] encounter 06/30/2019    ALLERGIES:  is allergic to egg-derived products, iodinated contrast media, lisinopril, adhesive [tape], asa [aspirin], asmanex (120 metered doses) [mometasone furoate], biaxin [clarithromycin], erythromycin, evista [raloxifene hcl], influenza vaccines, nexium [esomeprazole magnesium], norco [hydrocodone-acetaminophen], oxycodone, prilosec [omeprazole], qvar [beclomethasone], tessalon [benzonatate], and topiramate.  MEDICATIONS:  Current Outpatient Medications  Medication Sig Dispense Refill   acetaminophen (TYLENOL) 500 MG tablet Take 500 mg by mouth every 6 (six) hours as needed for mild pain or moderate pain.     albuterol (VENTOLIN HFA) 108 (90 Base) MCG/ACT inhaler Inhale 1 puff into the lungs every 6 (six) hours as needed for wheezing or shortness of breath.     Albuterol Sulfate 108 (90 Base) MCG/ACT AEPB Inhale into the lungs. Nebulizer     amitriptyline (ELAVIL) 10 MG tablet Take 10 mg by mouth at bedtime.     arformoterol (BROVANA) 15 MCG/2ML NEBU Inhale 15 mcg into the lungs in the morning and at bedtime.     Butalbital-APAP-Caff-Cod 50-300-40-30 MG CAPS Take by mouth as needed.     Ca Carbonate-Mag Hydroxide (ROLAIDS PO) Take by mouth. Patient  states that she uses 2-3 times weekly.     clopidogrel (PLAVIX) 75 MG tablet Take 75 mg by mouth daily.     Cyanocobalamin (VITAMIN B-12 IJ) Inject as directed every 21 ( twenty-one) days.      denosumab (PROLIA) 60 MG/ML SOSY injection Inject 60 mg into the skin every 6 (six) months.     diazepam (VALIUM) 2 MG tablet Take 2 mg by mouth. Patient takes as needed for dizziness.     dicyclomine  (BENTYL ) 10 MG capsule  Take 1 capsule (10 mg total) by mouth every 8 (eight) hours as needed for spasms. 270 capsule 3   diphenhydrAMINE (BENADRYL) 25 MG tablet Take 25 mg by mouth at bedtime.     ergocalciferol (VITAMIN D2) 1.25 MG (50000 UT) capsule Take 50,000 Units by mouth once a week. Patient states that she takes 1 by mouth twice a week.     furosemide (LASIX) 20 MG tablet      levothyroxine (SYNTHROID) 25 MCG tablet Take 25 mcg by mouth daily.     linaclotide  (LINZESS ) 145 MCG CAPS capsule Take 1 capsule (145 mcg total) by mouth daily before breakfast. 90 capsule 1   meclizine (ANTIVERT) 12.5 MG tablet Take 12.5 mg by mouth 3 (three) times daily as needed for dizziness.     metFORMIN (GLUCOPHAGE-XR) 500 MG 24 hr tablet Take 2 tablets by mouth in the morning and at bedtime.     metoprolol tartrate (LOPRESSOR) 25 MG tablet Take 50 mg by mouth 2 (two) times daily.     OXYGEN Inhale 2 L into the lungs continuous. 3 L at night     pantoprazole  (PROTONIX ) 40 MG tablet TAKE 1 TABLET BY MOUTH EVERY DAY 90 tablet 3   pravastatin (PRAVACHOL) 10 MG tablet Take 10 mg by mouth daily.     No current facility-administered medications for this visit.    SURGICAL HISTORY:  Past Surgical History:  Procedure Laterality Date   ABDOMINAL HYSTERECTOMY     ALLOGRAFT SPINE MORSELIZED Bilateral 1226/2017   from [facility] encounter 06/30/2019, DrDavid Prior   APPENDECTOMY     CATARACT EXTRACTION Bilateral    CHOLECYSTECTOMY     COLONOSCOPY     LAPAROTOMY     with mesh (per pt, bowel tissue adhered due to endometriosis)   SPINE SURGERY  10/31/2021   stint  02/21/2023   TONSILLECTOMY     UPPER GASTROINTESTINAL ENDOSCOPY      REVIEW OF SYSTEMS:   Review of Systems  Constitutional: Negative for appetite change, chills, fatigue, fever and unexpected weight change.  HENT:   Negative for mouth sores, nosebleeds, sore throat and trouble swallowing.   Eyes: Negative for eye problems and icterus.  Respiratory: Negative for  cough, hemoptysis, shortness of breath and wheezing.   Cardiovascular: Negative for chest pain and leg swelling.  Gastrointestinal: Negative for abdominal pain, constipation, diarrhea, nausea and vomiting.  Genitourinary: Negative for bladder incontinence, difficulty urinating, dysuria, frequency and hematuria.   Musculoskeletal: Negative for back pain, gait problem, neck pain and neck stiffness.  Skin: Negative for itching and rash.  Neurological: Negative for dizziness, extremity weakness, gait problem, headaches, light-headedness and seizures.  Hematological: Negative for adenopathy. Does not bruise/bleed easily.  Psychiatric/Behavioral: Negative for confusion, depression and sleep disturbance. The patient is not nervous/anxious.     PHYSICAL EXAMINATION:  There were no vitals taken for this visit.  ECOG PERFORMANCE STATUS: {CHL ONC ECOG [id number]  Physical Exam  Constitutional: Oriented to person, place, and  time and well-developed, well-nourished, and in no distress. No distress.  HENT:  Head: Normocephalic and atraumatic.  Mouth/Throat: Oropharynx is clear and moist. No oropharyngeal exudate.  Eyes: Conjunctivae are normal. Right eye exhibits no discharge. Left eye exhibits no discharge. No scleral icterus.  Neck: Normal range of motion. Neck supple.  Cardiovascular: Normal rate, regular rhythm, normal heart sounds and intact distal pulses.   Pulmonary/Chest: Effort normal and breath sounds normal. No respiratory distress. No wheezes. No rales.  Abdominal: Soft. Bowel sounds are normal. Exhibits no distension and no mass. There is no tenderness.  Musculoskeletal: Normal range of motion. Exhibits no edema.  Lymphadenopathy:    No cervical adenopathy.  Neurological: Alert and oriented to person, place, and time. Exhibits normal muscle tone. Gait normal. Coordination normal.  Skin: Skin is warm and dry. No rash noted. Not diaphoretic. No erythema. No pallor.  Psychiatric: Mood,  memory and judgment normal.  Vitals reviewed.  LABORATORY DATA: Lab Results  Component Value Date   WBC 7.6 04/23/2023   HGB 12.2 04/23/2023   HCT 36.4 04/23/2023   MCV 85.2 04/23/2023   PLT 229 04/23/2023      Chemistry      Component Value Date/Time   NA 134 (L) 04/23/2023 1455   K 3.9 04/23/2023 1455   CL 103 04/23/2023 1455   CO2 24 04/23/2023 1455   BUN 17 04/23/2023 1455   CREATININE 1.29 (H) 04/23/2023 1455   CREATININE 0.80 01/02/2019 1329      Component Value Date/Time   CALCIUM 9.9 04/23/2023 1455   ALKPHOS 53 04/23/2023 1455   AST 17 04/23/2023 1455   ALT 9 04/23/2023 1455   BILITOT 0.3 04/23/2023 1455       RADIOGRAPHIC STUDIES:  No results found.   ASSESSMENT/PLAN:  This is a very pleasant 68 year old Caucasian female with hemochromatosis with DNA mutation and C282Y and H63D.   Her Jak 2 mutation panel was unremarkable. The patient is currently on phlebotomy to keep her ferritin level less than 50.  Labs were reviewed. Recommend ***  We will see her back for labs and follow up in *** months with repeat blood work.   - Await iron and ferritin results - Schedule phlebotomy if needed - Follow-up in six months    No orders of the defined types were placed in this encounter.    I spent {CHL ONC TIME VISIT - DTPQU:[id number]} counseling the patient face to face. The total time spent in the appointment was {CHL ONC TIME VISIT - DTPQU:[id number]}.  Arsen Mangione L Rayquan Amrhein, PA-C 10/18/23

## 2023-10-25 ENCOUNTER — Ambulatory Visit: Payer: Medicare Other | Admitting: Internal Medicine

## 2023-10-25 ENCOUNTER — Other Ambulatory Visit: Payer: Medicare Other

## 2023-10-30 ENCOUNTER — Inpatient Hospital Stay (HOSPITAL_BASED_OUTPATIENT_CLINIC_OR_DEPARTMENT_OTHER): Admitting: Physician Assistant

## 2023-10-30 ENCOUNTER — Inpatient Hospital Stay: Attending: Internal Medicine

## 2023-10-30 DIAGNOSIS — E119 Type 2 diabetes mellitus without complications: Secondary | ICD-10-CM | POA: Diagnosis not present

## 2023-10-30 LAB — CBC WITH DIFFERENTIAL (CANCER CENTER ONLY)
Abs Immature Granulocytes: 0.04 K/uL (ref 0.00–0.07)
Basophils Absolute: 0.1 K/uL (ref 0.0–0.1)
Basophils Relative: 1 %
Eosinophils Absolute: 0.4 K/uL (ref 0.0–0.5)
Eosinophils Relative: 4 %
HCT: 37.3 % (ref 36.0–46.0)
Hemoglobin: 12.2 g/dL (ref 12.0–15.0)
Immature Granulocytes: 1 %
Lymphocytes Relative: 23 %
Lymphs Abs: 2 K/uL (ref 0.7–4.0)
MCH: 28.2 pg (ref 26.0–34.0)
MCHC: 32.7 g/dL (ref 30.0–36.0)
MCV: 86.3 fL (ref 80.0–100.0)
Monocytes Absolute: 0.8 K/uL (ref 0.1–1.0)
Monocytes Relative: 9 %
Neutro Abs: 5.3 K/uL (ref 1.7–7.7)
Neutrophils Relative %: 62 %
Platelet Count: 231 K/uL (ref 150–400)
RBC: 4.32 MIL/uL (ref 3.87–5.11)
RDW: 13.2 % (ref 11.5–15.5)
WBC Count: 8.5 K/uL (ref 4.0–10.5)
nRBC: 0 % (ref 0.0–0.2)

## 2023-10-30 LAB — CMP (CANCER CENTER ONLY)
ALT: 8 U/L (ref 0–44)
AST: 13 U/L — ABNORMAL LOW (ref 15–41)
Albumin: 4.3 g/dL (ref 3.5–5.0)
Alkaline Phosphatase: 53 U/L (ref 38–126)
Anion gap: 9 (ref 5–15)
BUN: 15 mg/dL (ref 8–23)
CO2: 25 mmol/L (ref 22–32)
Calcium: 9.4 mg/dL (ref 8.9–10.3)
Chloride: 105 mmol/L (ref 98–111)
Creatinine: 1.47 mg/dL — ABNORMAL HIGH (ref 0.44–1.00)
GFR, Estimated: 38 mL/min — ABNORMAL LOW (ref >60)
Glucose, Bld: 122 mg/dL — ABNORMAL HIGH (ref 70–99)
Potassium: 3.9 mmol/L (ref 3.5–5.1)
Sodium: 139 mmol/L (ref 135–145)
Total Bilirubin: 0.2 mg/dL (ref 0.0–1.2)
Total Protein: 7 g/dL (ref 6.5–8.1)

## 2023-10-30 LAB — FERRITIN: Ferritin: 37 ng/mL (ref 11–307)

## 2023-10-30 LAB — IRON AND IRON BINDING CAPACITY (CC-WL,HP ONLY)
Iron: 71 ug/dL (ref 28–170)
Saturation Ratios: 17 % (ref 10.4–31.8)
TIBC: 427 ug/dL (ref 250–450)
UIBC: 356 ug/dL (ref 148–442)

## 2023-10-31 ENCOUNTER — Encounter: Payer: Self-pay | Admitting: Gastroenterology

## 2023-10-31 ENCOUNTER — Ambulatory Visit (INDEPENDENT_AMBULATORY_CARE_PROVIDER_SITE_OTHER): Admitting: Gastroenterology

## 2023-10-31 VITALS — BP 110/72 | HR 61 | Ht 62.0 in | Wt 199.0 lb

## 2023-10-31 DIAGNOSIS — R1030 Lower abdominal pain, unspecified: Secondary | ICD-10-CM

## 2023-10-31 DIAGNOSIS — K5909 Other constipation: Secondary | ICD-10-CM

## 2023-10-31 DIAGNOSIS — K599 Functional intestinal disorder, unspecified: Secondary | ICD-10-CM

## 2023-10-31 DIAGNOSIS — G8929 Other chronic pain: Secondary | ICD-10-CM

## 2023-10-31 DIAGNOSIS — K581 Irritable bowel syndrome with constipation: Secondary | ICD-10-CM

## 2023-10-31 DIAGNOSIS — J9611 Chronic respiratory failure with hypoxia: Secondary | ICD-10-CM

## 2023-10-31 MED ORDER — DICYCLOMINE HCL 10 MG PO CAPS: 10.0000 mg | ORAL_CAPSULE | Freq: Three times a day (TID) | ORAL | 3 refills | Status: AC | PRN

## 2023-10-31 MED ORDER — LINACLOTIDE 145 MCG PO CAPS
145.0000 ug | ORAL_CAPSULE | Freq: Every day | ORAL | 3 refills | Status: AC
Start: 2023-10-31 — End: ?

## 2023-10-31 NOTE — Progress Notes (Unsigned)
 Laurie Finley    [id number]    [birth year]  Primary Care Physician:Eason, Deward, MD  Referring Physician: Graydon Deward, MD [id number] [place],  [place] [address]   Chief complaint:  IBS  Discussed the use of AI scribe software for clinical note transcription with the patient, who gave verbal consent to proceed.  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  Abdominal pain and bowel spasms - Persistent, significant abdominal pain primarily located in the lower abdomen - Pain has been stable but particularly bothersome this week - Pain is managed with dicyclomine , taken before leaving home and as needed for spasms - Dicyclomine  provides symptomatic relief; on severe pain days, requires two doses - Cautious with dicyclomine  use due to sedating effects, prefers to take one pill unless pain persists  Bowel habits and constipation - Bowel habits are generally regular with daily Linzess  use - Did not take Linzess  for two days due to travel, which may have contributed to current discomfort - No bowel movement the previous night - Sensation of incomplete evacuation  Dietary intake and hydration - Mindful of diet, aiming for adequate fiber and hydration - Occasional difficulty maintaining appetite due to feeling unwell  Prior diagnostic evaluation - Prior workup includes CT scans to rule out major issues such as tumors   GI Hx: Gastric emptying study 10/23/20 Expected location of the stomach in the left upper quadrant. Ingested meal empties the stomach gradually over the course of the study with 11.24% retention at 60 min and 2.55% retention at 120 min (normal retention less than 30% at a 120 min).   TTG Ig Ab negative for celiac disease     History of hemochromatosis, gets periodic phlebotomy for iron chelation.   CT abdomen and pelvis 08/15/2019 No CT findings to  account for the patient's chronic abdominal pain. Status post cholecystectomy, appendectomy, and hysterectomy.   Colonoscopy October 03, 2019: For diarrhea evaluation, random colon biopsies negative for microscopic colitis.  Diverticulosis and internal hemorrhoid EGD October 03, 2019: Mild erosive esophagitis secondary to reflux, hiatal hernia, CLOtest negative for H. pylori otherwise normal exam   Outpatient Encounter Medications as of 10/31/2023  Medication Sig   acetaminophen (TYLENOL) 500 MG tablet Take 500 mg by mouth every 6 (six) hours as needed for mild pain or moderate pain.   albuterol (VENTOLIN HFA) 108 (90 Base) MCG/ACT inhaler Inhale 1 puff into the lungs every 6 (six) hours as needed for wheezing or shortness of breath.   Albuterol Sulfate 108 (90 Base) MCG/ACT AEPB Inhale into the lungs. Nebulizer   amitriptyline (ELAVIL) 10 MG tablet Take 10 mg by mouth at bedtime.   amLODipine (NORVASC) 2.5 MG tablet Take 2.5 mg by mouth. As needed if BP above 100   arformoterol (BROVANA) 15 MCG/2ML NEBU Inhale 15 mcg into the lungs in the morning and at bedtime.   Butalbital-APAP-Caff-Cod 50-300-40-30 MG CAPS Take by mouth as needed.   Ca Carbonate-Mag Hydroxide (ROLAIDS PO) Take by mouth. Patient states that she uses 2-3 times weekly.   clopidogrel (PLAVIX) 75 MG tablet Take 75 mg by mouth daily.   Cyanocobalamin (VITAMIN B-12 IJ) Inject as directed every 21 ( twenty-one) days.    denosumab (PROLIA) 60 MG/ML SOSY injection Inject 60 mg into the skin every 6 (six) months.   diazepam (VALIUM) 2 MG tablet Take 2 mg by mouth.  Patient takes as needed for dizziness.   dicyclomine  (BENTYL ) 10 MG capsule Take 1 capsule (10 mg total) by mouth every 8 (eight) hours as needed for spasms.   diphenhydrAMINE (BENADRYL) 25 MG tablet Take 25 mg by mouth at bedtime.   ergocalciferol (VITAMIN D2) 1.25 MG (50000 UT) capsule Take 50,000 Units by mouth once a week. Patient states that she takes 1 by mouth twice a week.    furosemide (LASIX) 20 MG tablet    levothyroxine (SYNTHROID) 25 MCG tablet Take 25 mcg by mouth daily.   linaclotide  (LINZESS ) 145 MCG CAPS capsule Take 1 capsule (145 mcg total) by mouth daily before breakfast.   meclizine (ANTIVERT) 12.5 MG tablet Take 12.5 mg by mouth 3 (three) times daily as needed for dizziness.   metFORMIN (GLUCOPHAGE-XR) 500 MG 24 hr tablet Take 2 tablets by mouth in the morning and at bedtime.   metoprolol tartrate (LOPRESSOR) 25 MG tablet Take 50 mg by mouth 2 (two) times daily.   OXYGEN Inhale 2 L into the lungs continuous. 3 L at night   pantoprazole  (PROTONIX ) 40 MG tablet TAKE 1 TABLET BY MOUTH EVERY DAY   pravastatin (PRAVACHOL) 10 MG tablet Take 10 mg by mouth daily.   No facility-administered encounter medications on file as of 10/31/2023.    Allergies as of 10/31/2023 - Review Complete 10/31/2023  Allergen Reaction Noted   Egg-derived products Anaphylaxis and Other (See Comments) 02/08/2009   Iodinated contrast media Hives 01/02/2019   Lisinopril  01/28/2015   Adhesive [tape]  01/02/2019   Asa [aspirin]  01/02/2019   Asmanex (120 metered doses) [mometasone furoate]  01/02/2019   Biaxin [clarithromycin]  01/02/2019   Erythromycin  01/02/2019   Evista [raloxifene hcl]  01/02/2019   Influenza vaccines  01/02/2019   Nexium [esomeprazole magnesium]  01/02/2019   Norco [hydrocodone-acetaminophen]  01/02/2019   Oxycodone  01/02/2019   Prilosec [omeprazole]  01/02/2019   Qvar [beclomethasone]  01/02/2019   Tessalon [benzonatate]  01/02/2019   Topiramate Itching 06/19/2018    Past Medical History:  Diagnosis Date   Allergic rhinitis from [facility] encounter 06/30/2019   Allergy    SEASONAL   Anxiety disorder    from [facility] 06/30/2019   Arthritis of lumbosacral spine    Asthma    Asthma    from [facility] encounter 06/30/2019   Cataract    BILATERAL-REMOVED   Cervical arthritis    Chest pain    Colitis 11/25/1999    from [facility] encounter 06/30/2019   Colitis due to Clostridium difficile 07/14/2003   from [facility] encounter 06/30/2019   Colon polyp    from [facility] encounter 06/30/2019   Diabetes (HCC)    Diarrhea    Dizzinesses    Essential hypertension    GERD (gastroesophageal reflux disease)    H. pylori infection    Hx of labyrinthitis 08/14/2006   from [facility] encounter 06/30/2019   Hyperlipemia    Hypokalemia 08/04/2003   from [facility] encounter 06/30/2019   Hypokalemia 08/04/2003   Hypothyroid    Idiopathic parathyroidism (HCC)    Menopausal syndrome    from [facility] encounter 06/30/2019   Neck pain    from [facility] encounter 06/30/2019   Osteoporosis    Palpitations    Preoperative cardiovascular examination    SNHL (sensorineural hearing loss)    from [facility] encounter 06/30/2019   Stroke [facility])    Unsteady gait    from [facility] encounter 06/30/2019    Past Surgical History:  Procedure Laterality Date   ABDOMINAL HYSTERECTOMY     ALLOGRAFT SPINE MORSELIZED Bilateral 1226/2017   from [facility] encounter 06/30/2019, DrDavid Prior   APPENDECTOMY     CATARACT EXTRACTION Bilateral    CHOLECYSTECTOMY     COLONOSCOPY     LAPAROTOMY     with mesh (per pt, bowel tissue adhered due to endometriosis)   SPINE SURGERY  10/31/2021   stint  02/21/2023   TONSILLECTOMY     UPPER GASTROINTESTINAL ENDOSCOPY      Family History  Problem Relation Age of Onset   Osteoporosis Mother    Stroke Mother    Pancreatic cancer Mother    Hypertension Father    Epilepsy Sister    Hypertension Sister    Liver cancer Sister    Diabetes Sister    Hypertension Sister    Healthy Sister    Hypertension Sister    Hypertension Sister    Hypertension Sister    Lung disease Sister    Hypertension Sister    Cirrhosis Sister    Hepatitis C Sister    Epilepsy Brother    Colon cancer Brother    Hypertension Brother    Other Brother         Brain bleed   Leukemia Brother    Stomach cancer Paternal Aunt    Celiac disease Nephew    Celiac disease Niece    Esophageal cancer Neg Hx    Rectal cancer Neg Hx     Social History   Socioeconomic History   Marital status: Married    Spouse name: Tommy   Number of children: 0   Years of education: Not on file   Highest education level: Not on file  Occupational History   Occupation: retired  Tobacco Use   Smoking status: Never   Smokeless tobacco: Never  Vaping Use   Vaping status: Never Used  Substance and Sexual Activity   Alcohol use: Never   Drug use: Never   Sexual activity: Yes    Partners: Male    Birth control/protection: Surgical  Other Topics Concern   Not on file  Social History Narrative   Not on file   Social Drivers of Health   Financial Resource Strain: Not on file  Food Insecurity: Not on file  Transportation Needs: Not on file  Physical Activity: Not on file  Stress: Not on file  Social Connections: Not on file  Intimate Partner Violence: Not on file      Review of systems: All other review of systems negative except as mentioned in the HPI.   Physical Exam: Vitals:   10/31/23 1429  BP: 110/72  Pulse: 61   Body mass index is 36.4 kg/m. 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 Recent increase in pain noted in the lower abdomen. Previous CT scans ruled out major pathologies such as tumors. Pain attributed to functional spasms triggered by stress, diet, and  constipation. - Continue dicyclomine  as needed for spasms, up to twice daily. - Refill dicyclomine  prescription to ensure supply through the end of the year. - Advise taking dicyclomine  at the onset of pain to prevent escalation. - Ensure adequate hydration and fiber intake to help manage symptoms.  Constipation Chronic constipation managed with Linzess . Regular bowel movements reported with daily Linzess , but recent travel and missed doses may have contributed to current abdominal pain. Constipation can exacerbate functional bowel disorder symptoms. - Continue Linzess  daily to manage constipation. - Advise resuming Linzess  after returning home to alleviate symptoms. - Ensure adequate hydration and fiber intake to support bowel regularity.        This visit required *** minutes of patient care (this includes precharting, chart review, review of results, face-to-face time used for counseling as well as treatment plan and follow-up. The patient was provided an opportunity to ask questions and all were answered. The patient agreed with the plan and demonstrated an understanding of the instructions.  LOIS Wilkie Mcgee , MD    CC: Graydon Mt, MD

## 2023-10-31 NOTE — Patient Instructions (Addendum)
 VISIT SUMMARY:  During your visit, we discussed your persistent abdominal pain and bowel spasms, which have been particularly bothersome this week. We also reviewed your bowel habits, dietary intake, and prior diagnostic evaluations.  YOUR PLAN:  FUNCTIONAL BOWEL DISORDER WITH ABDOMINAL PAIN AND SPASMS: You have a chronic functional bowel disorder that causes abdominal pain and spasms, especially in the lower abdomen. Your pain has been more intense this week. -Continue taking dicyclomine  as needed for spasms, up to twice daily. -Refill your dicyclomine  prescription to ensure you have enough through the end of the year. -Take dicyclomine  at the onset of pain to prevent it from getting worse. -Make sure to drink plenty of water and eat enough fiber to help manage your symptoms.  CONSTIPATION: Your chronic constipation is managed with Linzess , but missing doses during travel may have contributed to your current discomfort. -Continue taking Linzess  daily to manage constipation. -Resume taking Linzess  now that you are back home to help alleviate your symptoms. -Ensure you are drinking plenty of water and eating enough fiber to support regular bowel movements.  I appreciate the  opportunity to care for you  Thank You   Kavitha Nandigam , MD

## 2023-11-01 ENCOUNTER — Encounter: Payer: Self-pay | Admitting: Gastroenterology

## 2023-11-01 ENCOUNTER — Encounter: Payer: Self-pay | Admitting: Physician Assistant

## 2023-11-06 ENCOUNTER — Encounter: Payer: Self-pay | Admitting: Internal Medicine

## 2023-11-06 ENCOUNTER — Telehealth: Payer: Self-pay

## 2023-11-06 ENCOUNTER — Other Ambulatory Visit (HOSPITAL_COMMUNITY): Payer: Self-pay

## 2023-11-06 NOTE — Telephone Encounter (Signed)
 Pharmacy Patient Advocate Encounter   Received notification from CoverMyMeds that prior authorization for Dicyclomine  HCl 10MG  capsules is required/requested.   Insurance verification completed.   The patient is insured through [employer] [facility] Medicare.   Per test claim: PA required; PA submitted to above mentioned insurance via Latent Key/confirmation #/EOC [id number] Status is pending

## 2023-11-07 NOTE — Telephone Encounter (Signed)
 Pharmacy Patient Advocate Encounter  Received notification from [employer] [facility] [employer] that Prior Authorization for Dicyclomine  HCl 10MG  capsules has been APPROVED from 03-14-2023 to 11-05-2024   PA #/Case ID/Reference #: [id number]

## 2023-11-07 NOTE — Telephone Encounter (Signed)
 Noted pt. aware

## 2023-12-26 ENCOUNTER — Encounter (INDEPENDENT_AMBULATORY_CARE_PROVIDER_SITE_OTHER): Payer: Self-pay | Admitting: Gastroenterology

## 2024-01-07 ENCOUNTER — Encounter: Payer: Self-pay | Admitting: Gastroenterology

## 2024-01-08 NOTE — Telephone Encounter (Signed)
 This was sent to our Med Assist team for processing.

## 2024-01-11 NOTE — Telephone Encounter (Signed)
 Team, has this form come to you?

## 2024-01-16 NOTE — Telephone Encounter (Signed)
 Patient is requesting a update regarding if forms were received. Please advise, thank you

## 2024-01-31 NOTE — Telephone Encounter (Signed)
 Inbound call from patient stating she would like to know if abbvie forms have been faxed over Please advise  Thank you

## 2024-04-30 ENCOUNTER — Inpatient Hospital Stay: Admitting: Internal Medicine

## 2024-04-30 ENCOUNTER — Inpatient Hospital Stay

## 2024-05-01 ENCOUNTER — Inpatient Hospital Stay: Admitting: Physician Assistant

## 2024-05-01 ENCOUNTER — Inpatient Hospital Stay
